# Patient Record
Sex: Female | Born: 1995 | Race: White | Hispanic: No | Marital: Single | Smoking: Never smoker
Health system: Southern US, Community
[De-identification: ages and names within clinical notes are randomized; demographics above are authoritative.]

## PROBLEM LIST (undated history)

## (undated) DIAGNOSIS — F32A Depression, unspecified: Secondary | ICD-10-CM

## (undated) DIAGNOSIS — F419 Anxiety disorder, unspecified: Secondary | ICD-10-CM

## (undated) DIAGNOSIS — M549 Dorsalgia, unspecified: Secondary | ICD-10-CM

## (undated) HISTORY — PX: TONSILLECTOMY: SUR1361

---

## 2017-05-26 ENCOUNTER — Other Ambulatory Visit: Payer: Self-pay

## 2017-05-26 ENCOUNTER — Encounter (HOSPITAL_COMMUNITY): Payer: Self-pay | Admitting: Emergency Medicine

## 2017-05-26 ENCOUNTER — Emergency Department (HOSPITAL_COMMUNITY)
Admission: EM | Admit: 2017-05-26 | Discharge: 2017-05-26 | Disposition: A | Discharge status: Home / Self Care | Payer: BLUE CROSS/BLUE SHIELD | Attending: Emergency Medicine | Admitting: Emergency Medicine

## 2017-05-26 ENCOUNTER — Emergency Department (HOSPITAL_COMMUNITY): Payer: BLUE CROSS/BLUE SHIELD

## 2017-05-26 DIAGNOSIS — M5431 Sciatica, right side: Secondary | ICD-10-CM

## 2017-05-26 DIAGNOSIS — M5432 Sciatica, left side: Secondary | ICD-10-CM | POA: Insufficient documentation

## 2017-05-26 DIAGNOSIS — M545 Low back pain: Secondary | ICD-10-CM | POA: Diagnosis present

## 2017-05-26 LAB — URINALYSIS, ROUTINE W REFLEX MICROSCOPIC
Bilirubin Urine: NEGATIVE
GLUCOSE, UA: NEGATIVE mg/dL
HGB URINE DIPSTICK: NEGATIVE
KETONES UR: NEGATIVE mg/dL
Leukocytes, UA: NEGATIVE
Nitrite: NEGATIVE
PROTEIN: NEGATIVE mg/dL
Specific Gravity, Urine: 1.029 (ref 1.005–1.030)
pH: 5 (ref 5.0–8.0)

## 2017-05-26 LAB — PREGNANCY, URINE: Preg Test, Ur: NEGATIVE

## 2017-05-26 MED ORDER — CYCLOBENZAPRINE HCL 10 MG PO TABS: 10.0000 mg | ORAL_TABLET | Freq: Two times a day (BID) | ORAL | 0 refills | Status: DC | PRN

## 2017-05-26 MED ORDER — KETOROLAC TROMETHAMINE 30 MG/ML IJ SOLN
30.0000 mg | Freq: Once | INTRAMUSCULAR | Status: AC
  Administered 2017-05-26: 30 mg via INTRAMUSCULAR
  Filled 2017-05-26: qty 1

## 2017-05-26 MED ORDER — NAPROXEN 500 MG PO TABS: 500.0000 mg | ORAL_TABLET | Freq: Two times a day (BID) | ORAL | 0 refills | Status: DC

## 2017-05-26 NOTE — ED Triage Notes (Signed)
Pt to ED for lower back pain x several weeks. Endorses intermittent pain that shoots down into bilateral legs and knees. Pt ambulatory. Denies injury.

## 2017-05-26 NOTE — ED Provider Notes (Signed)
[facility] EMERGENCY DEPARTMENT Provider Note   CSN: [id number] Arrival date & time: 05/26/17  1809     History   Chief Complaint Chief Complaint  Patient presents with  . Back Pain    HPI Julie Huber is a 22 y.o. female who presents to ED for evaluation of 1 month history of lower midline back pain describes as aching and worse with ambulation and movement in bed.  Symptoms began gradually 1 month ago with no inciting injury or event.  She has tried several doses of ibuprofen and BC powders with mild improvement in her symptoms.  No previous history of similar symptoms in the past.  She states that yesterday and several times before that, the pain radiating down both of her legs and a sharp and shooting motion.  Denies any numbness in legs, loss of bowel or bladder function, fevers, history of cancer, history of IV drug use, dysuria, prior back surgeries.  HPI  No past medical history on file.  There are no active problems to display for this patient.   Past Surgical History:  Procedure Laterality Date  . TONSILLECTOMY       OB History   None      Home Medications    Prior to Admission medications   Medication Sig Start Date End Date Taking? Authorizing Provider  cyclobenzaprine (FLEXERIL) 10 MG tablet Take 1 tablet (10 mg total) by mouth 2 (two) times daily as needed for muscle spasms. 05/26/17   Wilberto Console, PA-C  naproxen (NAPROSYN) 500 MG tablet Take 1 tablet (500 mg total) by mouth 2 (two) times daily. 05/26/17   Dietrich Pates, PA-C    Family History No family history on file.  Social History Social History   Tobacco Use  . Smoking status: Not on file  Substance Use Topics  . Alcohol use: Not on file  . Drug use: Not on file     Allergies   Penicillins   Review of Systems Review of Systems  Constitutional: Negative for chills and fever.  Genitourinary: Negative for dysuria and flank pain.  Musculoskeletal: Positive for  back pain and myalgias. Negative for arthralgias, gait problem, joint swelling, neck pain and neck stiffness.  Skin: Negative for rash and wound.  Neurological: Negative for weakness and numbness.     Physical Exam Updated Vital Signs BP 103/85 (BP Location: Right Arm)   Pulse (!) 107   Temp 98.2 F (36.8 C) (Oral)   Resp 14   Ht 5\' 5"  (1.651 m)   Wt 134.3 kg (296 lb)   LMP 02/25/2017 (Approximate) Comment: negative preg test  SpO2 99%   BMI 49.26 kg/m   Physical Exam  Constitutional: She appears well-developed and well-nourished. No distress.  Obese. NAD.  HENT:  Head: Normocephalic and atraumatic.  Eyes: Conjunctivae and EOM are normal. No scleral icterus.  Neck: Normal range of motion.  Pulmonary/Chest: Effort normal. No respiratory distress.  Musculoskeletal: Normal range of motion. She exhibits tenderness. She exhibits no edema or deformity.       Back:  Tenderness to palpation at the midline of the lumbar spine. No midline spinal tenderness present in thoracic or cervical spine. No step-off palpated. No visible bruising, edema or temperature change noted. No objective signs of numbness present. No saddle anesthesia. 2+ DP pulses bilaterally. Sensation intact to light touch. Strength 5/5 in bilateral lower extremities.  Neurological: She is alert.  Skin: No rash noted. She is not diaphoretic.  Psychiatric: She  has a normal mood and affect.  Nursing note and vitals reviewed.    ED Treatments / Results  Labs (all labs ordered are listed, but only abnormal results are displayed) Labs Reviewed  URINALYSIS, ROUTINE W REFLEX MICROSCOPIC - Abnormal; Notable for the following components:      Result Value   APPearance HAZY (*)    All other components within normal limits  PREGNANCY, URINE    EKG None  Radiology Dg Lumbar Spine Complete  Result Date: 05/26/2017 CLINICAL DATA:  Lumbago for several weeks EXAM: LUMBAR SPINE - COMPLETE 4+ VIEW COMPARISON:  None.  FINDINGS: Frontal, lateral, spot lumbosacral lateral, and bilateral oblique views were obtained. There are 5 non-rib-bearing lumbar type vertebral bodies. There is a transitional lumbosacral vertebra. There is no fracture or spondylolisthesis. The disc spaces appear unremarkable. There is no appreciable facet arthropathy. IMPRESSION: No fracture or spondylolisthesis.  No evident arthropathy. Electronically Signed   By: Bretta BangWilliam  Woodruff III M.D.   On: 05/26/2017 20:37    Procedures Procedures (including critical care time)  Medications Ordered in ED Medications  ketorolac (TORADOL) 30 MG/ML injection 30 mg (has no administration in time range)     Initial Impression / Assessment and Plan / ED Course  I have reviewed the triage vital signs and the nursing notes.  Pertinent labs & imaging results that were available during my care of the patient were reviewed by me and considered in my medical decision making (see chart for details).     Patient presents to ED for evaluation of 1 month history of lower back pain that has intermittently radiated down both of her legs.  Reports improvement with NSAIDs.  Denies any injuries, falls, prior back surgeries, history of cancer, history of IV drug use, loss of bowel or bladder function or dysuria.  On physical exam she does have some midline lumbar spinal tenderness to palpation.  Lower extremities are neurovascularly intact. UA, Upreg unremarkable. Xray L spine unremarkable.  Suspect that her symptoms are most likely due to sciatica rather than cauda equina, dissection or other infectious or vascular cause.  Will give anti-inflammatories, muscle relaxer and advised her to do heat therapy as directed.  Patient appears stable for discharge at this time.  Strict return precautions given.  Portions of this note were generated with Scientist, clinical (histocompatibility and immunogenetics)Dragon dictation software. Dictation errors may occur despite best attempts at proofreading.   Final Clinical Impressions(s) /  ED Diagnoses   Final diagnoses:  Bilateral sciatica    ED Discharge Orders        Ordered    naproxen (NAPROSYN) 500 MG tablet  2 times daily     05/26/17 2040    cyclobenzaprine (FLEXERIL) 10 MG tablet  2 times daily PRN     05/26/17 2040       Dietrich PatesKhatri, Joncarlo Friberg, PA-C 05/26/17 2046    Alvira MondaySchlossman, Erin, MD 05/27/17 1351

## 2017-05-26 NOTE — ED Notes (Signed)
Patient transported to X-ray 

## 2017-05-26 NOTE — ED Notes (Signed)
Patient verbalizes understanding of discharge instructions. Opportunity for questioning and answers were provided. Armband removed by staff, pt discharged from ED.  

## 2017-09-13 ENCOUNTER — Other Ambulatory Visit: Payer: Self-pay

## 2017-09-13 ENCOUNTER — Emergency Department (HOSPITAL_COMMUNITY)
Admission: EM | Admit: 2017-09-13 | Discharge: 2017-09-13 | Disposition: A | Discharge status: Home / Self Care | Payer: Self-pay | Attending: Emergency Medicine | Admitting: Emergency Medicine

## 2017-09-13 ENCOUNTER — Emergency Department (HOSPITAL_COMMUNITY): Payer: Self-pay

## 2017-09-13 ENCOUNTER — Encounter (HOSPITAL_COMMUNITY): Payer: Self-pay | Admitting: Emergency Medicine

## 2017-09-13 DIAGNOSIS — M25561 Pain in right knee: Secondary | ICD-10-CM | POA: Insufficient documentation

## 2017-09-13 DIAGNOSIS — Z79899 Other long term (current) drug therapy: Secondary | ICD-10-CM | POA: Insufficient documentation

## 2017-09-13 LAB — POC URINE PREG, ED: Preg Test, Ur: NEGATIVE

## 2017-09-13 NOTE — ED Notes (Signed)
Ortho paged. 

## 2017-09-13 NOTE — Progress Notes (Signed)
Orthopedic Tech Progress Note Patient Details:  Julie Huber 05/05/1995 [id number]  Ortho Devices Type of Ortho Device: Ace wrap, Crutches Ortho Device/Splint Location: rle Ortho Device/Splint Interventions: Application   Post Interventions Patient Tolerated: Well Instructions Provided: Care of device   Nikki DomCrawford, Uziel Covault 09/13/2017, 3:40 PM

## 2017-09-13 NOTE — Discharge Instructions (Signed)
It was my pleasure taking care of you today!   Please see the information and instructions below regarding your visit.  Your diagnoses today include:  1. Acute pain of right knee    There are many causes of knee pain.  Often 1 of the ligaments that hold the knee together, or the meniscus could get a small tear if you did a motion where you twisted or planted with a twist.  Based on where your pain is, this could be the meniscus or lateral collateral ligament.  Tests performed today include: See side panel of your discharge paperwork for testing performed today. Vital signs are listed at the bottom of these instructions.   Medications prescribed:    Take any prescribed medications only as prescribed, and any over the counter medications only as directed on the packaging.  Naproxen 375 mg twice daily as needed for pain. Use crutches as needed for comfort. Ice and elevate knee throughout the day.  You may alternate with Tylenol, 600 mg every 6 hours as needed for pain.  Home care instructions:  Please follow any educational materials contained in this packet.   Follow-up instructions: Please follow-up with your primary care provider as soon as possible for further evaluation of your symptoms if they are not completely improved.   Call the orthopedist listed today or tomorrow to schedule follow up appointment for recheck of ongoing knee pain in one to two weeks. That appointment can be canceled with a 24-48 hour notice if complete resolution of pain.   Follow up with the orthopedist listed if symptoms do not improve in one week.   Return instructions:  Please return to the Emergency Department if you experience worsening symptoms.  Please return for any increasing swelling, increasing pain, loss of color to the lower leg, or increasing redness. Please return if you have any other emergent concerns.  Additional Information:   Your vital signs today were: BP 130/72 (BP Location: Right  Arm)    Pulse (!) 108    Temp 98.2 F (36.8 C) (Oral)    Resp 16    Ht 5\' 5"  (1.651 m)    Wt 131.5 kg (290 lb)    SpO2 96%    BMI 48.26 kg/m  If your blood pressure (BP) was elevated on multiple readings during this visit above 130 for the top number or above 80 for the bottom number, please have this repeated by your primary care provider within one month. --------------

## 2017-09-13 NOTE — ED Triage Notes (Addendum)
Pt with right lateral knee pain w/o injury. States when she points her toe she feels a pull. Ambulator with steady gait.

## 2017-09-13 NOTE — ED Notes (Signed)
Patient transported to X-ray 

## 2017-09-13 NOTE — ED Provider Notes (Signed)
[facility] Provider Note   CSN: [id number] Arrival date & time: 09/13/17  1434     History   Chief Complaint Chief Complaint  Patient presents with  . Knee Pain    HPI Julie Huber is a 22 y.o. female.  HPI  Patient is a 22 year old female wit no significant past medical history presenting for right knee pain.  Patient reports that is been aching for a couple weeks, but she noticed that it became acutely more painful yesterday.  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  Patient denies any specific injury when her knee first started hurting.  Patient noticed that it is most painful when she is ambulating, as well as when she is performing plantar flexion.  Patient does note that she saw swelling over the anterolateral aspect of the right knee earlier this week that has gone down.  Patient denies any erythema, fevers or chills.  Patient has been taking naproxen and ibuprofen 800 mg without full relief.  History reviewed. No pertinent past medical history.  There are no active problems to display for this patient.   Past Surgical History:  Procedure Laterality Date  . TONSILLECTOMY       OB History   None      Home Medications    Prior to Admission medications   Medication Sig Start Date End Date Taking? Authorizing Provider  cyclobenzaprine (FLEXERIL) 10 MG tablet Take 1 tablet (10 mg total) by mouth 2 (two) times daily as needed for muscle spasms. 05/26/17   Khatri, Hina, PA-C  naproxen (NAPROSYN) 500 MG tablet Take 1 tablet (500 mg total) by mouth 2 (two) times daily. 05/26/17   Dietrich PatesKhatri, Hina, PA-C    Family History No family history on file.  Social History Social History   Tobacco Use  . Smoking status: Never Smoker  . Smokeless tobacco: Never Used  Substance Use Topics  . Alcohol use: Not Currently  . Drug use:  Never     Allergies   Penicillins   Review of Systems Review of Systems  Constitutional: Negative for chills and fever.  Musculoskeletal: Positive for arthralgias and joint swelling.  Skin: Negative for color change and rash.  Neurological: Negative for weakness and numbness.     Physical Exam Updated Vital Signs BP 130/72 (BP Location: Right Arm)   Pulse (!) 108   Temp 98.2 F (36.8 C) (Oral)   Resp 16   Ht 5\' 5"  (1.651 m)   Wt 131.5 kg (290 lb)   SpO2 96%   BMI 48.26 kg/m   Physical Exam  Constitutional: She appears well-developed and well-nourished. No distress.  Sitting comfortably in bed.  HENT:  Head: Normocephalic and atraumatic.  Eyes: Conjunctivae are normal. Right eye exhibits no discharge. Left eye exhibits no discharge.  EOMs normal to gross examination.  Neck: Normal range of motion.  Cardiovascular: Normal rate and regular rhythm.  Intact, 2+ DP pulses bilaterally.  Pulmonary/Chest:  Normal respiratory effort. Patient converses comfortably. No audible wheeze or stridor.  Abdominal: She exhibits no distension.  Musculoskeletal: Normal range of motion.  Right knee with tenderness to palpation of anterolateral knee. Full ROM.  No joint line tenderness. No joint effusion or swelling appreciated. No abnormal alignment or patellar mobility. No bruising, erythema or warmth overlaying the joint. No varus/valgus laxity. Negative drawer's, Lachman's and McMurray's.  No crepitus.  2+ DP pulses bilaterally. All compartments are soft. Sensation intact distal to injury.   Neurological: She is alert.  Cranial nerves intact to gross observation. Patient moves extremities without difficulty. Antalgic gait, favoring right.  Skin: Skin is warm and dry. She is not diaphoretic.  Psychiatric: She has a normal mood and affect. Her behavior is normal. Judgment and thought content normal.  Nursing note and vitals reviewed.    ED Treatments / Results  Labs (all labs  ordered are listed, but only abnormal results are displayed) Labs Reviewed  POC URINE PREG, ED    EKG None  Radiology Dg Knee Complete 4 Views Right  Result Date: 09/13/2017 CLINICAL DATA:  Pain EXAM: RIGHT KNEE - COMPLETE 4+ VIEW COMPARISON:  None. FINDINGS: Frontal, lateral, and bilateral oblique views were obtained. No fracture or dislocation. No joint effusion. Joint spaces appear normal. No erosive change. IMPRESSION: No evident fracture or joint effusion.  No appreciable arthropathy. Electronically Signed   By: Bretta Bang III M.D.   On: 09/13/2017 15:59    Procedures Procedures (including critical care time)  Medications Ordered in ED Medications - No data to display   Initial Impression / Assessment and Plan / ED Course  I have reviewed the triage vital signs and the nursing notes.  Pertinent labs & imaging results that were available during my care of the patient were reviewed by me and considered in my medical decision making (see chart for details).  Clinical Course as of Sep 13 1701  Tue Sep 13, 2017  1655 Tachycardia resolved.    [AM]    Clinical Course User Index [AM] Elisha Ponder, PA-C    Patient nontoxic-appearing, afebrile, and in no acute distress.  There is no evidence of septic arthritis of the right knee.  No acute injury to suggest ACL tear, or occult fracture. No bone tumors palpated or seen on xray.  Suspect strain of lateral collateral ligament given pain with plantar flexion, or meniscus tear.  Ace wrap applied, patient given crutches with instructions to rest, ice, elevate, and compress.  Return precautions given for any erythema or worsening edema.  Patient is in understanding and agrees with the plan of care.  Final Clinical Impressions(s) / ED Diagnoses   Final diagnoses:  Acute pain of right knee    ED Discharge Orders    None       Delia Chimes 09/13/17 1703    Mesner, Barbara Cower, MD 09/13/17 2103

## 2017-10-29 ENCOUNTER — Emergency Department (HOSPITAL_COMMUNITY): Payer: Self-pay

## 2017-10-29 ENCOUNTER — Other Ambulatory Visit: Payer: Self-pay

## 2017-10-29 ENCOUNTER — Encounter (HOSPITAL_COMMUNITY): Payer: Self-pay | Admitting: Emergency Medicine

## 2017-10-29 ENCOUNTER — Emergency Department (HOSPITAL_COMMUNITY)
Admission: EM | Admit: 2017-10-29 | Discharge: 2017-10-30 | Disposition: A | Discharge status: Home / Self Care | Payer: Self-pay | Attending: Emergency Medicine | Admitting: Emergency Medicine

## 2017-10-29 DIAGNOSIS — R0789 Other chest pain: Secondary | ICD-10-CM | POA: Insufficient documentation

## 2017-10-29 LAB — CBC
HCT: 39.5 % (ref 36.0–46.0)
HEMOGLOBIN: 12.9 g/dL (ref 12.0–15.0)
MCH: 26.2 pg (ref 26.0–34.0)
MCHC: 32.7 g/dL (ref 30.0–36.0)
MCV: 80.3 fL (ref 78.0–100.0)
Platelets: 358 10*3/uL (ref 150–400)
RBC: 4.92 MIL/uL (ref 3.87–5.11)
RDW: 13.3 % (ref 11.5–15.5)
WBC: 9.2 10*3/uL (ref 4.0–10.5)

## 2017-10-29 LAB — BASIC METABOLIC PANEL
ANION GAP: 12 (ref 5–15)
BUN: 12 mg/dL (ref 6–20)
CALCIUM: 9.3 mg/dL (ref 8.9–10.3)
CO2: 25 mmol/L (ref 22–32)
Chloride: 104 mmol/L (ref 98–111)
Creatinine, Ser: 0.62 mg/dL (ref 0.44–1.00)
GFR calc non Af Amer: >60 mL/min (ref >60)
Glucose, Bld: 146 mg/dL — ABNORMAL HIGH (ref 70–99)
POTASSIUM: 4.2 mmol/L (ref 3.5–5.1)
Sodium: 141 mmol/L (ref 135–145)

## 2017-10-29 LAB — POCT I-STAT TROPONIN I: TROPONIN I, POC: 0.01 ng/mL (ref 0.00–0.08)

## 2017-10-29 LAB — I-STAT BETA HCG BLOOD, ED (NOT ORDERABLE)

## 2017-10-29 NOTE — ED Triage Notes (Signed)
Pt reports chest pain for the last 2 days with pain worsening when laying flat.

## 2017-10-30 LAB — D-DIMER, QUANTITATIVE: D-Dimer, Quant: 0.3 ug/mL-FEU (ref 0.00–0.50)

## 2017-10-30 MED ORDER — RANITIDINE HCL 150 MG PO TABS: 150.0000 mg | ORAL_TABLET | Freq: Two times a day (BID) | ORAL | 0 refills | Status: DC

## 2017-10-30 MED ORDER — IBUPROFEN 800 MG PO TABS: 800.0000 mg | ORAL_TABLET | Freq: Three times a day (TID) | ORAL | 0 refills | Status: DC | PRN

## 2017-10-30 NOTE — ED Provider Notes (Signed)
[facility]-EMERGENCY DEPT Provider Note   CSN: [id number] Arrival date & time: 10/29/17  2244     History   Chief Complaint Chief Complaint  Patient presents with  . Chest Pain    HPI Julie Huber is a 22 y.o. female.  Patient presents to the emergency department for evaluation of chest pain.  Patient reports that the pain began 2 days ago.  It is in the center of her chest and radiates under the left breast.  She does report that it seems worse when she lies flat, especially on her stomach and and other certain positions.  She denies any injury.  She has felt like the pain worsens when she breathes at times.  She has not had any cough or fever.  Patient does not use oral contraceptives.  No recent travel.  No unilateral leg swelling.     History reviewed. No pertinent past medical history.  There are no active problems to display for this patient.   Past Surgical History:  Procedure Laterality Date  . TONSILLECTOMY       OB History   None      Home Medications    Prior to Admission medications   Medication Sig Start Date End Date Taking? Authorizing Provider  acetaminophen (TYLENOL) 500 MG tablet Take 1,000 mg by mouth every 6 (six) hours as needed for mild pain.    [provider]  ibuprofen (ADVIL,MOTRIN) 800 MG tablet Take 1 tablet (800 mg total) by mouth every 8 (eight) hours as needed for moderate pain. 10/30/17   Gilda Crease, MD  ranitidine (ZANTAC) 150 MG tablet Take 1 tablet (150 mg total) by mouth 2 (two) times daily. 10/30/17   Gilda Crease, MD    Family History History reviewed. No pertinent family history.  Social History Social History   Tobacco Use  . Smoking status: Never Smoker  . Smokeless tobacco: Never Used  Substance Use Topics  . Alcohol use: Not Currently  . Drug use: Never     Allergies   Penicillins   Review of Systems Review of Systems  Cardiovascular: Positive for  chest pain.  All other systems reviewed and are negative.    Physical Exam Updated Vital Signs BP (!) 146/99 (BP Location: Left Arm) Comment: RN aware  Pulse 91   Temp 97.9 F (36.6 C) (Oral)   Resp 18   Ht 5\' 5"  (1.651 m)   Wt 131.5 kg   SpO2 98%   BMI 48.26 kg/m   Physical Exam  Constitutional: She is oriented to person, place, and time. She appears well-developed and well-nourished. No distress.  HENT:  Head: Normocephalic and atraumatic.  Right Ear: Hearing normal.  Left Ear: Hearing normal.  Nose: Nose normal.  Mouth/Throat: Oropharynx is clear and moist and mucous membranes are normal.  Eyes: Pupils are equal, round, and reactive to light. Conjunctivae and EOM are normal.  Neck: Normal range of motion. Neck supple.  Cardiovascular: Regular rhythm, S1 normal and S2 normal. Exam reveals no gallop and no friction rub.  No murmur heard. Pulmonary/Chest: Effort normal and breath sounds normal. No respiratory distress. She exhibits no tenderness.  Abdominal: Soft. Normal appearance and bowel sounds are normal. There is no hepatosplenomegaly. There is no tenderness. There is no rebound, no guarding, no tenderness at McBurney's point and negative Murphy's sign. No hernia.  Musculoskeletal: Normal range of motion.  Neurological: She is alert and oriented to person, place, and time. She has  normal strength. No cranial nerve deficit or sensory deficit. Coordination normal. GCS eye subscore is 4. GCS verbal subscore is 5. GCS motor subscore is 6.  Skin: Skin is warm, dry and intact. No rash noted. No cyanosis.  Psychiatric: She has a normal mood and affect. Her speech is normal and behavior is normal. Thought content normal.  Nursing note and vitals reviewed.    ED Treatments / Results  Labs (all labs ordered are listed, but only abnormal results are displayed) Labs Reviewed  BASIC METABOLIC PANEL - Abnormal; Notable for the following components:      Result Value   Glucose,  Bld 146 (*)    All other components within normal limits  CBC  D-DIMER, QUANTITATIVE (NOT AT [facility])  I-STAT TROPONIN, ED  I-STAT BETA HCG BLOOD, ED ([facility], [facility], [facility] ONLY)  POCT I-STAT TROPONIN I  I-STAT BETA HCG BLOOD, ED (NOT ORDERABLE)    EKG EKG Interpretation  Date/Time:  Saturday October 29 2017 22:56:36 EDT Ventricular Rate:  97 PR Interval:    QRS Duration: 91 QT Interval:  338 QTC Calculation: 430 R Axis:   68 Text Interpretation:  Sinus rhythm Normal ECG Confirmed by Gilda Crease [phone]) on 10/29/2017 11:56:21 PM   Radiology Dg Chest 2 View  Result Date: 10/29/2017 CLINICAL DATA:  Worsening chest pain for 2 days. EXAM: CHEST - 2 VIEW COMPARISON:  None. FINDINGS: Cardiac silhouette is upper limits of normal size, mediastinal silhouette is not suspicious. No pleural effusion or focal consolidation. No pneumothorax. Large body habitus. Osseous structures are normal. IMPRESSION: Borderline cardiomegaly.  No acute pulmonary process. Electronically Signed   By: Awilda Metro M.D.   On: 10/29/2017 23:32    Procedures Procedures (including critical care time)  Medications Ordered in ED Medications - No data to display   Initial Impression / Assessment and Plan / ED Course  I have reviewed the triage vital signs and the nursing notes.  Pertinent labs & imaging results that were available during my care of the patient were reviewed by me and considered in my medical decision making (see chart for details).     Patient presents with left-sided chest pain.  She does not have any significant cardiac risk factors other than obesity.  Symptoms are not consistent with ACS or MI.  EKG is normal, troponin negative.  Troponin negative after 2 days of continuous and unrelenting pain.  This is not cardiac.  She does not have any signs of DVT on examination, is not tachycardic, tachypneic or dyspneic.  D-dimer is normal.  This is unlikely to be PE based on this work-up.  Patient  does report changes in pain with changes in position, likely chest wall in nature.  Will also cover for reflux with Zantac.  Final Clinical Impressions(s) / ED Diagnoses   Final diagnoses:  Chest wall pain    ED Discharge Orders         Ordered    ranitidine (ZANTAC) 150 MG tablet  2 times daily     10/30/17 0036    ibuprofen (ADVIL,MOTRIN) 800 MG tablet  Every 8 hours PRN     10/30/17 0036           Gilda Crease, MD 10/30/17 [phone]

## 2017-11-13 ENCOUNTER — Other Ambulatory Visit: Payer: Self-pay

## 2017-11-13 ENCOUNTER — Encounter (HOSPITAL_COMMUNITY): Payer: Self-pay

## 2017-11-13 ENCOUNTER — Emergency Department (HOSPITAL_COMMUNITY)
Admission: EM | Admit: 2017-11-13 | Discharge: 2017-11-13 | Disposition: A | Discharge status: Home / Self Care | Payer: Self-pay | Attending: Emergency Medicine | Admitting: Emergency Medicine

## 2017-11-13 DIAGNOSIS — Z79899 Other long term (current) drug therapy: Secondary | ICD-10-CM | POA: Insufficient documentation

## 2017-11-13 DIAGNOSIS — M62830 Muscle spasm of back: Secondary | ICD-10-CM | POA: Insufficient documentation

## 2017-11-13 HISTORY — DX: Dorsalgia, unspecified: M54.9

## 2017-11-13 MED ORDER — METHOCARBAMOL 500 MG PO TABS: 500.0000 mg | ORAL_TABLET | Freq: Two times a day (BID) | ORAL | 0 refills | Status: DC

## 2017-11-13 MED ORDER — NAPROXEN 500 MG PO TABS: 500.0000 mg | ORAL_TABLET | Freq: Two times a day (BID) | ORAL | 0 refills | Status: DC

## 2017-11-13 MED ORDER — METHOCARBAMOL 500 MG PO TABS
500.0000 mg | ORAL_TABLET | Freq: Once | ORAL | Status: AC
  Administered 2017-11-13: 500 mg via ORAL
  Filled 2017-11-13: qty 1

## 2017-11-13 MED ORDER — KETOROLAC TROMETHAMINE 30 MG/ML IJ SOLN
30.0000 mg | Freq: Once | INTRAMUSCULAR | Status: AC
  Administered 2017-11-13: 30 mg via INTRAMUSCULAR
  Filled 2017-11-13: qty 1

## 2017-11-13 NOTE — ED Triage Notes (Signed)
Pt BIB POV for eval of lower back pain. Pt reports she was seen for same approx 2 mos ago, started on flexeril and naprosyn which she reports. Stated at one point pain radiated down blt posterior legs, but at this time states pain starts at base of spine and radiates upwards

## 2017-11-13 NOTE — Discharge Instructions (Addendum)
Call Dr. Woodward KuHaddox's office to schedule follow up. Return here as needed.

## 2017-11-13 NOTE — ED Provider Notes (Signed)
[facility] EMERGENCY DEPARTMENT Provider Note   CSN: [id number] Arrival date & time: 11/13/17  1834     History   Chief Complaint Chief Complaint  Patient presents with  . Back Pain    HPI Julie Huber is a 22 y.o. female who presents to the ED with low back pain. Patient reports she was seen for the same problem 04/2017 and prescribed flexeril and naprosyn. Patient reports she had x-rays and dx with sciatica. The pain at that time was going down the legs but now going up the back. Patient reports the pain starts at the base of her spine and radiates to the upper back. No loss of control of bladder or bowels.  HPI  Past Medical History:  Diagnosis Date  . Back pain     There are no active problems to display for this patient.   Past Surgical History:  Procedure Laterality Date  . TONSILLECTOMY       OB History   None      Home Medications    Prior to Admission medications   Medication Sig Start Date End Date Taking? Authorizing Provider  acetaminophen (TYLENOL) 500 MG tablet Take 1,000 mg by mouth every 6 (six) hours as needed for mild pain.    [provider]  methocarbamol (ROBAXIN) 500 MG tablet Take 1 tablet (500 mg total) by mouth 2 (two) times daily. 11/13/17   Janne Napoleon, NP  naproxen (NAPROSYN) 500 MG tablet Take 1 tablet (500 mg total) by mouth 2 (two) times daily. 11/13/17   Janne Napoleon, NP  ranitidine (ZANTAC) 150 MG tablet Take 1 tablet (150 mg total) by mouth 2 (two) times daily. 10/30/17   Gilda Crease, MD    Family History History reviewed. No pertinent family history.  Social History Social History   Tobacco Use  . Smoking status: Never Smoker  . Smokeless tobacco: Never Used  Substance Use Topics  . Alcohol use: Not Currently  . Drug use: Never     Allergies   Penicillins   Review of Systems Review of Systems  Constitutional: Negative for chills and fever.  HENT: Negative.   Eyes:  Negative for pain, discharge and itching.  Respiratory: Negative for shortness of breath.   Cardiovascular: Negative for chest pain.  Gastrointestinal: Negative for abdominal pain, nausea and vomiting.  Genitourinary: Negative for dysuria, frequency and urgency.  Musculoskeletal: Positive for back pain.  Skin: Negative for rash and wound.  Neurological: Negative for syncope and headaches.  Psychiatric/Behavioral: Negative for confusion.     Physical Exam Updated Vital Signs BP 132/89 (BP Location: Right Arm)   Pulse (!) 115   Temp 98.7 F (37.1 C) (Oral)   Resp 18   Ht 5\' 5"  (1.651 m)   Wt 136.1 kg   LMP 11/13/2017   SpO2 98%   BMI 49.92 kg/m   Physical Exam  Constitutional: No distress.  Morbidly obese  HENT:  Head: Normocephalic and atraumatic.  Eyes: Conjunctivae and EOM are normal.  Neck: Normal range of motion. Neck supple.  Cardiovascular: Normal rate and regular rhythm.  Pulmonary/Chest: Effort normal and breath sounds normal.  Abdominal: Soft. Bowel sounds are normal. There is no tenderness.  Musculoskeletal: Normal range of motion.       Lumbar back: She exhibits tenderness and spasm. She exhibits normal range of motion, no edema, no deformity and normal pulse.  Straight leg raises without difficulty  Neurological: She is alert. She has  normal strength and normal reflexes. No sensory deficit. Gait normal.  Skin: Skin is warm and dry.  Psychiatric: She has a normal mood and affect. Her behavior is normal.  Nursing note and vitals reviewed.    ED Treatments / Results  Labs (all labs ordered are listed, but only abnormal results are displayed) Labs Reviewed - No data to display  Radiology No results found.  Procedures Procedures (including critical care time)  Medications Ordered in ED Medications  methocarbamol (ROBAXIN) tablet 500 mg (has no administration in time range)  ketorolac (TORADOL) 30 MG/ML injection 30 mg (has no administration in time  range)     Initial Impression / Assessment and Plan / ED Course  I have reviewed the triage vital signs and the nursing notes. Patient with back pain.  No neurological deficits and normal neuro exam.  Patient can walk but states is painful.  No loss of bowel or bladder control.  No concern for cauda equina.  No fever, night sweats, weight loss, h/o cancer, IVDU.  RICE protocol and pain medicine indicated and discussed with patient. Since this has been a repeated problem will refer patient to ortho.   Final Clinical Impressions(s) / ED Diagnoses   Final diagnoses:  Muscle spasm of back    ED Discharge Orders         Ordered    methocarbamol (ROBAXIN) 500 MG tablet  2 times daily     11/13/17 1920    naproxen (NAPROSYN) 500 MG tablet  2 times daily     11/13/17 1920           Damian Leavelleese, [place] Long PointHope M, NP 11/13/17 1926    Virgina NorfolkCuratolo, Adam, DO 11/13/17 2340

## 2017-11-26 ENCOUNTER — Other Ambulatory Visit: Payer: Self-pay

## 2017-11-26 ENCOUNTER — Emergency Department (HOSPITAL_COMMUNITY): Payer: Self-pay

## 2017-11-26 ENCOUNTER — Emergency Department (HOSPITAL_COMMUNITY)
Admission: EM | Admit: 2017-11-26 | Discharge: 2017-11-27 | Disposition: A | Discharge status: Home / Self Care | Payer: Self-pay | Attending: Emergency Medicine | Admitting: Emergency Medicine

## 2017-11-26 ENCOUNTER — Encounter (HOSPITAL_COMMUNITY): Payer: Self-pay

## 2017-11-26 DIAGNOSIS — R109 Unspecified abdominal pain: Secondary | ICD-10-CM

## 2017-11-26 DIAGNOSIS — R1032 Left lower quadrant pain: Secondary | ICD-10-CM | POA: Insufficient documentation

## 2017-11-26 DIAGNOSIS — Z79899 Other long term (current) drug therapy: Secondary | ICD-10-CM | POA: Insufficient documentation

## 2017-11-26 DIAGNOSIS — R000 Tachycardia, unspecified: Secondary | ICD-10-CM | POA: Insufficient documentation

## 2017-11-26 LAB — COMPREHENSIVE METABOLIC PANEL
ALK PHOS: 76 U/L (ref 38–126)
ALT: 22 U/L (ref 0–44)
ANION GAP: 8 (ref 5–15)
AST: 25 U/L (ref 15–41)
Albumin: 3.5 g/dL (ref 3.5–5.0)
BILIRUBIN TOTAL: 0.5 mg/dL (ref 0.3–1.2)
BUN: 9 mg/dL (ref 6–20)
CALCIUM: 9.2 mg/dL (ref 8.9–10.3)
CO2: 27 mmol/L (ref 22–32)
CREATININE: 0.76 mg/dL (ref 0.44–1.00)
Chloride: 103 mmol/L (ref 98–111)
GFR calc Af Amer: >60 mL/min (ref >60)
Glucose, Bld: 120 mg/dL — ABNORMAL HIGH (ref 70–99)
Potassium: 3.7 mmol/L (ref 3.5–5.1)
Sodium: 138 mmol/L (ref 135–145)
TOTAL PROTEIN: 6.6 g/dL (ref 6.5–8.1)

## 2017-11-26 LAB — CBC
HCT: 39.9 % (ref 36.0–46.0)
HEMOGLOBIN: 12.4 g/dL (ref 12.0–15.0)
MCH: 25.8 pg — AB (ref 26.0–34.0)
MCHC: 31.1 g/dL (ref 30.0–36.0)
MCV: 83.1 fL (ref 78.0–100.0)
PLATELETS: 328 10*3/uL (ref 150–400)
RBC: 4.8 MIL/uL (ref 3.87–5.11)
RDW: 13.2 % (ref 11.5–15.5)
WBC: 10.5 10*3/uL (ref 4.0–10.5)

## 2017-11-26 LAB — URINALYSIS, ROUTINE W REFLEX MICROSCOPIC
Bilirubin Urine: NEGATIVE
Glucose, UA: NEGATIVE mg/dL
HGB URINE DIPSTICK: NEGATIVE
Ketones, ur: NEGATIVE mg/dL
LEUKOCYTES UA: NEGATIVE
NITRITE: NEGATIVE
Protein, ur: NEGATIVE mg/dL
Specific Gravity, Urine: 1.032 — ABNORMAL HIGH (ref 1.005–1.030)
pH: 5 (ref 5.0–8.0)

## 2017-11-26 LAB — I-STAT BETA HCG BLOOD, ED (MC, WL, AP ONLY): I-stat hCG, quantitative: <5 m[IU]/mL

## 2017-11-26 LAB — LIPASE, BLOOD: Lipase: 25 U/L (ref 11–51)

## 2017-11-26 MED ORDER — KETOROLAC TROMETHAMINE 60 MG/2ML IM SOLN
60.0000 mg | Freq: Once | INTRAMUSCULAR | Status: AC
  Administered 2017-11-26: 60 mg via INTRAMUSCULAR
  Filled 2017-11-26: qty 2

## 2017-11-26 NOTE — ED Notes (Signed)
Patient transported to Ultrasound 

## 2017-11-26 NOTE — ED Provider Notes (Signed)
[facility] EMERGENCY DEPARTMENT Provider Note   CSN: [id number] Arrival date & time: 11/26/17  1834     History   Chief Complaint Chief Complaint  Patient presents with  . Abdominal Pain    HPI Julie Huber is a 22 y.o. female.  Patient presents with left lower flank pain mild radiation to the groin.  No history of similar.  No history of kidney infection or kidney stones.  No abdominal surgery history.  No fevers or chills.  No urinary symptoms or vaginal symptoms.  Pain for the past 3 days intermittent.     Past Medical History:  Diagnosis Date  . Back pain     There are no active problems to display for this patient.   Past Surgical History:  Procedure Laterality Date  . TONSILLECTOMY       OB History   None      Home Medications    Prior to Admission medications   Medication Sig Start Date End Date Taking? Authorizing Provider  acetaminophen (TYLENOL) 500 MG tablet Take 1,000 mg by mouth every 6 (six) hours as needed for mild pain.   Yes [provider]  cyclobenzaprine (FLEXERIL) 10 MG tablet Take 10 mg by mouth daily as needed for muscle spasms.   Yes [provider]  ferrous sulfate 325 (65 FE) MG tablet Take 325 mg by mouth daily after breakfast.   Yes [provider]  naproxen (NAPROSYN) 500 MG tablet Take 1 tablet (500 mg total) by mouth 2 (two) times daily. Patient taking differently: Take 500 mg by mouth 2 (two) times daily as needed (pain).  11/13/17  Yes Neese, Hope M, NP  methocarbamol (ROBAXIN) 500 MG tablet Take 1 tablet (500 mg total) by mouth 2 (two) times daily. Patient not taking: Reported on 11/26/2017 11/13/17   Janne Napoleon, NP  ranitidine (ZANTAC) 150 MG tablet Take 1 tablet (150 mg total) by mouth 2 (two) times daily. Patient not taking: Reported on 11/26/2017 10/30/17   Gilda Crease, MD    Family History No family history on file.  Social History Social History    Tobacco Use  . Smoking status: Never Smoker  . Smokeless tobacco: Never Used  Substance Use Topics  . Alcohol use: Not Currently  . Drug use: Never     Allergies   Penicillins   Review of Systems Review of Systems  Constitutional: Negative for chills and fever.  HENT: Negative for congestion.   Eyes: Negative for visual disturbance.  Respiratory: Negative for shortness of breath.   Cardiovascular: Negative for chest pain.  Gastrointestinal: Positive for abdominal pain. Negative for vomiting.  Genitourinary: Positive for flank pain. Negative for dysuria.  Musculoskeletal: Negative for back pain, neck pain and neck stiffness.  Skin: Negative for rash.  Neurological: Negative for light-headedness and headaches.     Physical Exam Updated Vital Signs BP (!) 106/58   Pulse 98   Temp 97.8 F (36.6 C) (Oral)   Resp 16   LMP 11/13/2017   SpO2 97%   Physical Exam  Constitutional: She is oriented to person, place, and time. She appears well-developed and well-nourished.  HENT:  Head: Normocephalic and atraumatic.  Eyes: Conjunctivae are normal. Right eye exhibits no discharge. Left eye exhibits no discharge.  Neck: Normal range of motion. Neck supple. No tracheal deviation present.  Cardiovascular: Regular rhythm. Tachycardia present.  Pulmonary/Chest: Effort normal and breath sounds normal.  Abdominal: Soft. She exhibits no distension. There  is no tenderness. There is no guarding.  Musculoskeletal: She exhibits no edema.  Neurological: She is alert and oriented to person, place, and time.  Skin: Skin is warm. No rash noted.  Psychiatric: She has a normal mood and affect.  Nursing note and vitals reviewed.    ED Treatments / Results  Labs (all labs ordered are listed, but only abnormal results are displayed) Labs Reviewed  COMPREHENSIVE METABOLIC PANEL - Abnormal; Notable for the following components:      Result Value   Glucose, Bld 120 (*)    All other  components within normal limits  CBC - Abnormal; Notable for the following components:   MCH 25.8 (*)    All other components within normal limits  URINALYSIS, ROUTINE W REFLEX MICROSCOPIC - Abnormal; Notable for the following components:   Color, Urine AMBER (*)    APPearance HAZY (*)    Specific Gravity, Urine 1.032 (*)    All other components within normal limits  LIPASE, BLOOD  I-STAT BETA HCG BLOOD, ED ([facility], [facility], [facility] ONLY)    EKG None  Radiology US Transvaginal Non-ob  Result Date: 11/26/2017 CLINICAL DATA:  22 year old female with left adnexal pain. EXAM: TRANSABDOMINAL AND TRANSVAGINAL ULTRASOUND OF PELVIS DOPPLER ULTRASOUND OF OVARIES TECHNIQUE: Both transabdominal and transvaginal ultrasound examinations of the pelvis were performed. Transabdominal technique was performed for global imaging of the pelvis including uterus, ovaries, adnexal regions, and pelvic cul-de-sac. It was necessary to proceed with endovaginal exam following the transabdominal exam to visualize the endometrium and ovaries. Color and duplex Doppler ultrasound was utilized to evaluate blood flow to the ovaries. COMPARISON:  CT of the abdomen pelvis dated 11/26/2017 FINDINGS: Uterus Measurements: 7.4 x 3.0 x 3.9 cm. No fibroids or other mass visualized. Endometrium Thickness: 7 mm.  No focal abnormality visualized. Right ovary Measurements: 2.9 x 2.2 x 1.8 cm. Normal appearance/no adnexal mass. Left ovary Measurements: 2.7 x 1.9 x 2.1 cm. Normal appearance/no adnexal mass. Pulsed Doppler evaluation of both ovaries demonstrates normal low-resistance arterial and venous waveforms. Other findings No abnormal free fluid. IMPRESSION: Unremarkable pelvic ultrasound. Electronically Signed   By: Elgie Collard M.D.   On: 11/26/2017 23:51   US Pelvis Complete  Result Date: 11/26/2017 CLINICAL DATA:  22 year old female with left adnexal pain. EXAM: TRANSABDOMINAL AND TRANSVAGINAL ULTRASOUND OF PELVIS DOPPLER ULTRASOUND OF  OVARIES TECHNIQUE: Both transabdominal and transvaginal ultrasound examinations of the pelvis were performed. Transabdominal technique was performed for global imaging of the pelvis including uterus, ovaries, adnexal regions, and pelvic cul-de-sac. It was necessary to proceed with endovaginal exam following the transabdominal exam to visualize the endometrium and ovaries. Color and duplex Doppler ultrasound was utilized to evaluate blood flow to the ovaries. COMPARISON:  CT of the abdomen pelvis dated 11/26/2017 FINDINGS: Uterus Measurements: 7.4 x 3.0 x 3.9 cm. No fibroids or other mass visualized. Endometrium Thickness: 7 mm.  No focal abnormality visualized. Right ovary Measurements: 2.9 x 2.2 x 1.8 cm. Normal appearance/no adnexal mass. Left ovary Measurements: 2.7 x 1.9 x 2.1 cm. Normal appearance/no adnexal mass. Pulsed Doppler evaluation of both ovaries demonstrates normal low-resistance arterial and venous waveforms. Other findings No abnormal free fluid. IMPRESSION: Unremarkable pelvic ultrasound. Electronically Signed   By: Elgie Collard M.D.   On: 11/26/2017 23:51   [place] Art/ven Flow Abd Pelv Doppler  Result Date: 11/26/2017 CLINICAL DATA:  22 year old female with left adnexal pain. EXAM: TRANSABDOMINAL AND TRANSVAGINAL ULTRASOUND OF PELVIS DOPPLER ULTRASOUND OF OVARIES TECHNIQUE: Both transabdominal and transvaginal ultrasound  examinations of the pelvis were performed. Transabdominal technique was performed for global imaging of the pelvis including uterus, ovaries, adnexal regions, and pelvic cul-de-sac. It was necessary to proceed with endovaginal exam following the transabdominal exam to visualize the endometrium and ovaries. Color and duplex Doppler ultrasound was utilized to evaluate blood flow to the ovaries. COMPARISON:  CT of the abdomen pelvis dated 11/26/2017 FINDINGS: Uterus Measurements: 7.4 x 3.0 x 3.9 cm. No fibroids or other mass visualized. Endometrium Thickness: 7 mm.  No focal  abnormality visualized. Right ovary Measurements: 2.9 x 2.2 x 1.8 cm. Normal appearance/no adnexal mass. Left ovary Measurements: 2.7 x 1.9 x 2.1 cm. Normal appearance/no adnexal mass. Pulsed Doppler evaluation of both ovaries demonstrates normal low-resistance arterial and venous waveforms. Other findings No abnormal free fluid. IMPRESSION: Unremarkable pelvic ultrasound. Electronically Signed   By: Elgie Collard M.D.   On: 11/26/2017 23:51   Ct Renal Stone Study  Result Date: 11/26/2017 CLINICAL DATA:  Left flank pain, nausea EXAM: CT ABDOMEN AND PELVIS WITHOUT CONTRAST TECHNIQUE: Multidetector CT imaging of the abdomen and pelvis was performed following the standard protocol without IV contrast. COMPARISON:  None. FINDINGS: Lower chest: Lung bases are clear. Hepatobiliary: Unenhanced liver is unremarkable. Gallbladder is underdistended but unremarkable. No intrahepatic or extrahepatic ductal dilatation. Pancreas: Within normal limits. Spleen: Within normal limits. Adrenals/Urinary Tract: Adrenal glands are within normal limits. Kidneys are within normal limits. No renal, ureteral, or bladder calculi. No hydronephrosis. Bladder is underdistended although unremarkable. Stomach/Bowel: Stomach is within normal limits. No evidence of bowel obstruction. Normal appendix (series 3/image 67). Left colon is decompressed. Vascular/Lymphatic: No evidence of abdominal aortic aneurysm. No suspicious abdominopelvic lymphadenopathy. Reproductive: Uterus is within normal limits. Bilateral ovaries are within normal limits. Other: No abdominopelvic ascites. Musculoskeletal: Visualized osseous structures are within normal limits. IMPRESSION: Negative CT abdomen/pelvis. Electronically Signed   By: Charline Bills M.D.   On: 11/26/2017 21:20    Procedures Procedures (including critical care time)  Medications Ordered in ED Medications  ketorolac (TORADOL) injection 60 mg (60 mg Intramuscular Given 11/26/17 2118)      Initial Impression / Assessment and Plan / ED Course  I have reviewed the triage vital signs and the nursing notes.  Pertinent labs & imaging results that were available during my care of the patient were reviewed by me and considered in my medical decision making (see chart for details).    Patient presents with left flank pain rating to the groin.  Clinical concern for kidney stone versus urine infection.  Urinalysis no significant signs of infection reviewed.  CT scan pending.  Pain meds ordered. Preg neg.  Patient's pain controlled on reassessment well-appearing.  CT scan no acute findings no kidney stone.  Ultrasound no cause for her left flank pain.  Patient stable for outpatient follow-up.  Results and differential diagnosis were discussed with the patient/parent/guardian. Xrays were independently reviewed by myself.  Close follow up outpatient was discussed, comfortable with the plan.   Medications  ketorolac (TORADOL) injection 60 mg (60 mg Intramuscular Given 11/26/17 2118)    Vitals:   11/26/17 2030 11/26/17 2130 11/26/17 2200 11/26/17 2230  BP: 111/62 122/72 109/73 (!) 106/58  Pulse: (!) 109 89 (!) 107 98  Resp: (!) 9 20 (!) 22 16  Temp:      TempSrc:      SpO2: 97% 97% 97% 97%    Final diagnoses:  Acute left flank pain       Final Clinical Impressions(s) / ED  Diagnoses   Final diagnoses:  Acute left flank pain    ED Discharge Orders    None       Blane Ohara, MD 11/27/17 0010

## 2017-11-26 NOTE — ED Triage Notes (Signed)
Pt states that for the past 3 days she has been having LLQ abd pain that radiates to her L flank area, denies urinary symptoms, denies discharge or fevers.

## 2017-11-26 NOTE — Discharge Instructions (Signed)
Follow-up with a primary doctor if your symptoms worsen or new symptoms Monday or Tuesday. Take Tylenol and or Motrin as needed for pain.

## 2018-02-19 ENCOUNTER — Other Ambulatory Visit: Payer: Self-pay

## 2018-02-19 ENCOUNTER — Emergency Department (HOSPITAL_COMMUNITY)
Admission: EM | Admit: 2018-02-19 | Discharge: 2018-02-20 | Disposition: A | Discharge status: Home / Self Care | Payer: Self-pay | Attending: Emergency Medicine | Admitting: Emergency Medicine

## 2018-02-19 ENCOUNTER — Encounter (HOSPITAL_COMMUNITY): Payer: Self-pay | Admitting: Emergency Medicine

## 2018-02-19 DIAGNOSIS — R1084 Generalized abdominal pain: Secondary | ICD-10-CM | POA: Insufficient documentation

## 2018-02-19 NOTE — ED Triage Notes (Signed)
Patient here c/o lower abdominal pain radiating to lower back. Pain 6/10, shooting pain. No urinary complaints. Also states she's had thick yellow vaginal discharge in the past week.

## 2018-02-20 LAB — CBC WITH DIFFERENTIAL/PLATELET
ABS IMMATURE GRANULOCYTES: 0.06 10*3/uL (ref 0.00–0.07)
Basophils Absolute: 0 10*3/uL (ref 0.0–0.1)
Basophils Relative: 0 %
Eosinophils Absolute: 0.3 10*3/uL (ref 0.0–0.5)
Eosinophils Relative: 2 %
HEMATOCRIT: 42.5 % (ref 36.0–46.0)
HEMOGLOBIN: 12.9 g/dL (ref 12.0–15.0)
Immature Granulocytes: 1 %
LYMPHS ABS: 3.3 10*3/uL (ref 0.7–4.0)
Lymphocytes Relative: 32 %
MCH: 25 pg — AB (ref 26.0–34.0)
MCHC: 30.4 g/dL (ref 30.0–36.0)
MCV: 82.2 fL (ref 80.0–100.0)
MONO ABS: 0.8 10*3/uL (ref 0.1–1.0)
MONOS PCT: 8 %
NEUTROS ABS: 5.8 10*3/uL (ref 1.7–7.7)
Neutrophils Relative %: 57 %
Platelets: 353 10*3/uL (ref 150–400)
RBC: 5.17 MIL/uL — ABNORMAL HIGH (ref 3.87–5.11)
RDW: 13.3 % (ref 11.5–15.5)
WBC: 10.3 10*3/uL (ref 4.0–10.5)
nRBC: 0 % (ref 0.0–0.2)

## 2018-02-20 LAB — COMPREHENSIVE METABOLIC PANEL
ALK PHOS: 83 U/L (ref 38–126)
ALT: 25 U/L (ref 0–44)
AST: 20 U/L (ref 15–41)
Albumin: 3.6 g/dL (ref 3.5–5.0)
Anion gap: 11 (ref 5–15)
BUN: 9 mg/dL (ref 6–20)
CALCIUM: 9.3 mg/dL (ref 8.9–10.3)
CO2: 28 mmol/L (ref 22–32)
CREATININE: 0.77 mg/dL (ref 0.44–1.00)
Chloride: 103 mmol/L (ref 98–111)
Glucose, Bld: 100 mg/dL — ABNORMAL HIGH (ref 70–99)
Potassium: 4 mmol/L (ref 3.5–5.1)
SODIUM: 142 mmol/L (ref 135–145)
Total Bilirubin: 0.6 mg/dL (ref 0.3–1.2)
Total Protein: 7.1 g/dL (ref 6.5–8.1)

## 2018-02-20 LAB — URINALYSIS, ROUTINE W REFLEX MICROSCOPIC
BILIRUBIN URINE: NEGATIVE
Glucose, UA: NEGATIVE mg/dL
HGB URINE DIPSTICK: NEGATIVE
Ketones, ur: NEGATIVE mg/dL
Leukocytes, UA: NEGATIVE
Nitrite: NEGATIVE
PH: 6 (ref 5.0–8.0)
Protein, ur: NEGATIVE mg/dL
SPECIFIC GRAVITY, URINE: 1.025 (ref 1.005–1.030)

## 2018-02-20 LAB — GC/CHLAMYDIA PROBE AMP (~~LOC~~) NOT AT ARMC
CHLAMYDIA, DNA PROBE: NEGATIVE
Neisseria Gonorrhea: NEGATIVE

## 2018-02-20 LAB — WET PREP, GENITAL
Clue Cells Wet Prep HPF POC: NONE SEEN
Sperm: NONE SEEN
TRICH WET PREP: NONE SEEN
Yeast Wet Prep HPF POC: NONE SEEN

## 2018-02-20 LAB — PREGNANCY, URINE: Preg Test, Ur: NEGATIVE

## 2018-02-20 MED ORDER — SODIUM CHLORIDE 0.9 % IV BOLUS
1000.0000 mL | Freq: Once | INTRAVENOUS | Status: AC
  Administered 2018-02-20: 1000 mL via INTRAVENOUS

## 2018-02-20 MED ORDER — DICYCLOMINE HCL 20 MG PO TABS: 20.0000 mg | ORAL_TABLET | Freq: Two times a day (BID) | ORAL | 0 refills | Status: DC | PRN

## 2018-02-20 MED ORDER — KETOROLAC TROMETHAMINE 30 MG/ML IJ SOLN
30.0000 mg | Freq: Once | INTRAMUSCULAR | Status: AC
  Administered 2018-02-20: 30 mg via INTRAVENOUS
  Filled 2018-02-20: qty 1

## 2018-02-20 MED ORDER — IBUPROFEN 600 MG PO TABS: 600.0000 mg | ORAL_TABLET | Freq: Four times a day (QID) | ORAL | 0 refills | Status: DC | PRN

## 2018-02-20 NOTE — Discharge Instructions (Signed)
Drink plenty of fluids to prevent dehydration.  We recommend use of ibuprofen and Bentyl for abdominal cramping.  Follow-up with a primary doctor as soon as you are able.  You may return to the ED for new or concerning symptoms.

## 2018-02-20 NOTE — ED Notes (Signed)
Pelvic at bedside   

## 2018-02-20 NOTE — ED Notes (Signed)
Patient verbalizes understanding of medications and discharge instructions. No further questions at this time. VSS and patient ambulatory at discharge.   

## 2018-02-20 NOTE — ED Provider Notes (Signed)
[facility] Provider Note   CSN: [id number] Arrival date & time: 02/19/18  2342     History   Chief Complaint Chief Complaint  Patient presents with  . Abdominal Pain    HPI Julie Huber is a 22 y.o. female.  22 year old female presents to the emergency department for evaluation of abdominal pain.  She reports sharp, shooting pain over the past week which has been intermittent.  It is worse with certain positions and is currently rated at 6/10.  She has taken Pamprin and Midol without relief.  Developed diarrhea 2 days ago which has been watery.  Notes onset of thick, yellow vaginal discharge yesterday.  She has been sexually active with one partner in the past 6 months.  Endorses unprotected sex, but denies concern for STDs.  She has not had any fevers, vomiting, dysuria, hematuria, melena.  No history of abdominal surgeries.  LMP was 1 month ago, approximately.  The history is provided by the patient. No language interpreter was used.  Abdominal Pain   Associated symptoms include diarrhea and nausea. Pertinent negatives include fever and vomiting.    Past Medical History:  Diagnosis Date  . Back pain     There are no active problems to display for this patient.   Past Surgical History:  Procedure Laterality Date  . TONSILLECTOMY       OB History   No obstetric history on file.      Home Medications    Prior to Admission medications   Medication Sig Start Date End Date Taking? Authorizing Provider  acetaminophen (TYLENOL) 500 MG tablet Take 1,000 mg by mouth every 6 (six) hours as needed for mild pain.   Yes [provider]  cyclobenzaprine (FLEXERIL) 10 MG tablet Take 10 mg by mouth daily as needed for muscle spasms.   Yes [provider]  ferrous sulfate 325 (65 FE) MG tablet Take 325 mg by mouth daily after breakfast.   Yes [provider]  naproxen (NAPROSYN) 500 MG tablet Take 1 tablet  (500 mg total) by mouth 2 (two) times daily. Patient taking differently: Take 500 mg by mouth 2 (two) times daily as needed (pain).  11/13/17  Yes Neese, Hope M, NP  dicyclomine (BENTYL) 20 MG tablet Take 1 tablet (20 mg total) by mouth every 12 (twelve) hours as needed (for abdominal pain/cramping). 02/20/18   Antony MaduraHumes, Latajah Thuman, PA-C  ibuprofen (ADVIL,MOTRIN) 600 MG tablet Take 1 tablet (600 mg total) by mouth every 6 (six) hours as needed for mild pain or moderate pain. 02/20/18   Antony MaduraHumes, Jodeci Roarty, PA-C  methocarbamol (ROBAXIN) 500 MG tablet Take 1 tablet (500 mg total) by mouth 2 (two) times daily. Patient not taking: Reported on 11/26/2017 11/13/17   Janne NapoleonNeese, Hope M, NP  ranitidine (ZANTAC) 150 MG tablet Take 1 tablet (150 mg total) by mouth 2 (two) times daily. Patient not taking: Reported on 11/26/2017 10/30/17   Gilda CreasePollina, Christopher J, MD    Family History History reviewed. No pertinent family history.  Social History Social History   Tobacco Use  . Smoking status: Never Smoker  . Smokeless tobacco: Never Used  Substance Use Topics  . Alcohol use: Not Currently  . Drug use: Never     Allergies   Penicillins   Review of Systems Review of Systems  Constitutional: Negative for fever.  Gastrointestinal: Positive for abdominal pain, diarrhea and nausea. Negative for vomiting.  Genitourinary: Positive for vaginal discharge.  Ten systems reviewed  and are negative for acute change, except as noted in the HPI.    Physical Exam Updated Vital Signs BP (!) 146/74 (BP Location: Right Wrist)   Pulse 96   Temp 98.3 F (36.8 C) (Oral)   Resp 19   Ht 5\' 5"  (1.651 m)   Wt 129.3 kg   LMP 01/16/2018   SpO2 100%   BMI 47.43 kg/m   Physical Exam Vitals signs and nursing note reviewed.  Constitutional:      General: She is not in acute distress.    Appearance: She is well-developed. She is not diaphoretic.     Comments: Super morbidly obese  HENT:     Head: Normocephalic and atraumatic.    Eyes:     General: No scleral icterus.    Conjunctiva/sclera: Conjunctivae normal.  Neck:     Musculoskeletal: Normal range of motion.  Cardiovascular:     Rate and Rhythm: Normal rate and regular rhythm.     Pulses: Normal pulses.  Pulmonary:     Effort: Pulmonary effort is normal. No respiratory distress.  Abdominal:     Palpations: Abdomen is soft.     Tenderness: There is abdominal tenderness. There is no guarding.     Comments: Exam limited 2/2 body habitus. TTP in the bilateral lower quadrants as well as periumbilically. No peritoneal signs noted.  Genitourinary:    Vagina: Vaginal discharge present.     Cervix: No cervical motion tenderness.     Comments: Exam challenging due to body habitus. There is scant pale yellow discharge in the vaginal vault. No CMT. No bleeding or trauma to vaginal vault. Musculoskeletal: Normal range of motion.  Skin:    General: Skin is warm and dry.     Coloration: Skin is not pale.     Findings: No erythema or rash.  Neurological:     Mental Status: She is alert and oriented to person, place, and time.     Coordination: Coordination normal.     Comments: Moving all extremities spontaneously.  Psychiatric:        Behavior: Behavior normal.      ED Treatments / Results  Labs (all labs ordered are listed, but only abnormal results are displayed) Labs Reviewed  WET PREP, GENITAL - Abnormal; Notable for the following components:      Result Value   WBC, Wet Prep HPF POC MANY (*)    All other components within normal limits  CBC WITH DIFFERENTIAL/PLATELET - Abnormal; Notable for the following components:   RBC 5.17 (*)    MCH 25.0 (*)    All other components within normal limits  COMPREHENSIVE METABOLIC PANEL - Abnormal; Notable for the following components:   Glucose, Bld 100 (*)    All other components within normal limits  URINALYSIS, ROUTINE W REFLEX MICROSCOPIC  PREGNANCY, URINE  GC/CHLAMYDIA PROBE AMP () NOT AT [facility]     EKG None  Radiology No results found.  Procedures Procedures (including critical care time)  Medications Ordered in ED Medications  sodium chloride 0.9 % bolus 1,000 mL (0 mLs Intravenous Stopped 02/20/18 0300)  ketorolac (TORADOL) 30 MG/ML injection 30 mg (30 mg Intravenous Given 02/20/18 0114)     3:24 AM Repeat abdominal exam is stable. No focal TTP. No guarding. Patient states that she is feeling better after IVF and Toradol.  Her laboratory work-up is reassuring without leukocytosis or electrolyte derangements.  Liver and kidney function preserved.  Urinalysis does not suggest UTI.  Pregnancy negative.  She presented for similar abdominal pain 3 months ago and underwent pelvic ultrasound and CT abdomen and pelvis.  These were noncontributory and without evidence of acute process.  In the setting of similar pain with reassuring labs and stable repeat abdominal exam, I do not believe further emergent work-up is indicated.  Patient agreeable to plan of discharge with return if symptoms worsen.   Initial Impression / Assessment and Plan / ED Course  I have reviewed the triage vital signs and the nursing notes.  Pertinent labs & imaging results that were available during my care of the patient were reviewed by me and considered in my medical decision making (see chart for details).     Patient is nontoxic, nonseptic appearing, in no apparent distress.  Patient's pain and other symptoms adequately managed in emergency department.  Fluid bolus given.  Labs, prior imaging and vitals reviewed.  Patient does not meet the SIRS or Sepsis criteria.  On repeat exam patient does not have a surgical abdomen and there are no peritoneal signs.  No indication of appendicitis, bowel obstruction, bowel perforation, cholecystitis, diverticulitis, PID or ectopic pregnancy.  Patient discharged home with symptomatic treatment and given strict instructions for follow-up with their primary care  physician.  I have also discussed reasons to return immediately to the ER.  Patient expresses understanding and agrees with plan.   Vitals:   02/20/18 0051 02/20/18 0052 02/20/18 0100 02/20/18 0405  BP: 125/72 125/72 118/89 (!) 146/74  Pulse: (!) 101 (!) 114 (!) 110 96  Resp:  19 18 19   Temp:      TempSrc:  Oral    SpO2: 100% 100% 98% 100%  Weight:      Height:        Final Clinical Impressions(s) / ED Diagnoses   Final diagnoses:  Generalized abdominal pain    ED Discharge Orders         Ordered    dicyclomine (BENTYL) 20 MG tablet  Every 12 hours PRN     02/20/18 0318    ibuprofen (ADVIL,MOTRIN) 600 MG tablet  Every 6 hours PRN     02/20/18 0318           Antony MaduraHumes, Devlin Brink, PA-C 02/20/18 [id number]    Geoffery Lyonselo, Douglas, MD 02/20/18 [phone]

## 2018-03-25 ENCOUNTER — Encounter (HOSPITAL_COMMUNITY): Payer: Self-pay

## 2018-03-25 ENCOUNTER — Emergency Department (HOSPITAL_COMMUNITY): Payer: Self-pay

## 2018-03-25 ENCOUNTER — Emergency Department (HOSPITAL_COMMUNITY)
Admission: EM | Admit: 2018-03-25 | Discharge: 2018-03-25 | Disposition: A | Discharge status: Home / Self Care | Payer: Self-pay | Attending: Emergency Medicine | Admitting: Emergency Medicine

## 2018-03-25 ENCOUNTER — Other Ambulatory Visit: Payer: Self-pay

## 2018-03-25 DIAGNOSIS — Z79899 Other long term (current) drug therapy: Secondary | ICD-10-CM | POA: Insufficient documentation

## 2018-03-25 DIAGNOSIS — M722 Plantar fascial fibromatosis: Secondary | ICD-10-CM | POA: Insufficient documentation

## 2018-03-25 MED ORDER — TRAMADOL HCL 50 MG PO TABS: 50.0000 mg | ORAL_TABLET | Freq: Four times a day (QID) | ORAL | 0 refills | Status: DC | PRN

## 2018-03-25 MED ORDER — PREDNISONE 50 MG PO TABS: 50.0000 mg | ORAL_TABLET | Freq: Every day | ORAL | 0 refills | Status: DC

## 2018-03-25 NOTE — ED Triage Notes (Signed)
Pt reports right leg pain from her knee down to her foot for 3 days. Pain increases with ambulation.

## 2018-03-25 NOTE — ED Notes (Signed)
Patient verbalizes understanding of discharge instructions. Opportunity for questioning and answers were provided. Armband removed by staff, pt discharged from ED.  

## 2018-03-25 NOTE — Discharge Instructions (Signed)
Return here as needed.  Use ice and heat on your leg and foot.  X-rays did not show any abnormalities at this time.  Follow-up with the orthopedist as needed.

## 2018-03-28 NOTE — ED Provider Notes (Signed)
[facility] EMERGENCY DEPARTMENT Provider Note   CSN: [id number] Arrival date & time: 03/25/18  2012     History   Chief Complaint Chief Complaint  Patient presents with  . Leg Pain    HPI Julie Huber is a 23 y.o. female.  HPI Patient presents to the emergency department with right foot pain that radiates up her leg.  The patient states this started while she was working several days ago.  The patient states that she does a lot of walking and standing at work.  Patient states that the bottom of her foot is mostly at the area of pain.  She states that she does have some anterior leg pain but mostly the bottom of her foot.  Patient denies any other injuries.  She states she did not take any medications prior to arrival for symptoms. Past Medical History:  Diagnosis Date  . Back pain     There are no active problems to display for this patient.   Past Surgical History:  Procedure Laterality Date  . TONSILLECTOMY       OB History   No obstetric history on file.      Home Medications    Prior to Admission medications   Medication Sig Start Date End Date Taking? Authorizing Provider  acetaminophen (TYLENOL) 500 MG tablet Take 1,000 mg by mouth every 6 (six) hours as needed for mild pain.    [provider]  cyclobenzaprine (FLEXERIL) 10 MG tablet Take 10 mg by mouth daily as needed for muscle spasms.    [provider]  dicyclomine (BENTYL) 20 MG tablet Take 1 tablet (20 mg total) by mouth every 12 (twelve) hours as needed (for abdominal pain/cramping). 02/20/18   Antony Madura, PA-C  ferrous sulfate 325 (65 FE) MG tablet Take 325 mg by mouth daily after breakfast.    [provider]  ibuprofen (ADVIL,MOTRIN) 600 MG tablet Take 1 tablet (600 mg total) by mouth every 6 (six) hours as needed for mild pain or moderate pain. 02/20/18   Antony Madura, PA-C  methocarbamol (ROBAXIN) 500 MG tablet Take 1 tablet (500 mg total) by  mouth 2 (two) times daily. Patient not taking: Reported on 11/26/2017 11/13/17   Janne Napoleon, NP  naproxen (NAPROSYN) 500 MG tablet Take 1 tablet (500 mg total) by mouth 2 (two) times daily. Patient taking differently: Take 500 mg by mouth 2 (two) times daily as needed (pain).  11/13/17   Janne Napoleon, NP  predniSONE (DELTASONE) 50 MG tablet Take 1 tablet (50 mg total) by mouth daily. 03/25/18   Khari Lett, Cristal Deer, PA-C  ranitidine (ZANTAC) 150 MG tablet Take 1 tablet (150 mg total) by mouth 2 (two) times daily. Patient not taking: Reported on 11/26/2017 10/30/17   Gilda Crease, MD  traMADol (ULTRAM) 50 MG tablet Take 1 tablet (50 mg total) by mouth every 6 (six) hours as needed for severe pain. 03/25/18   Charlestine Night, PA-C    Family History No family history on file.  Social History Social History   Tobacco Use  . Smoking status: Never Smoker  . Smokeless tobacco: Never Used  Substance Use Topics  . Alcohol use: Not Currently  . Drug use: Never     Allergies   Penicillins   Review of Systems Review of Systems  All other systems negative except as documented in the HPI. All pertinent positives and negatives as reviewed in the HPI. Physical Exam Updated Vital Signs BP  130/61 (BP Location: Right Arm)   Pulse 92   Temp 97.9 F (36.6 C) (Oral)   Resp 16   SpO2 100%   Physical Exam Vitals signs and nursing note reviewed.  Constitutional:      General: She is not in acute distress.    Appearance: She is well-developed.  HENT:     Head: Normocephalic and atraumatic.  Eyes:     Pupils: Pupils are equal, round, and reactive to light.  Pulmonary:     Effort: Pulmonary effort is normal.  Musculoskeletal:       Feet:  Skin:    General: Skin is warm and dry.  Neurological:     Mental Status: She is alert and oriented to person, place, and time.      ED Treatments / Results  Labs (all labs ordered are listed, but only abnormal results are  displayed) Labs Reviewed - No data to display  EKG None  Radiology No results found.  Procedures Procedures (including critical care time)  Medications Ordered in ED Medications - No data to display   Initial Impression / Assessment and Plan / ED Course  I have reviewed the triage vital signs and the nursing notes.  Pertinent labs & imaging results that were available during my care of the patient were reviewed by me and considered in my medical decision making (see chart for details).    Patient be treated for plantar fasciitis and given Ortho referral.  I did advise her to ice and stretch her foot.  Patient is advised to return here as needed patient agrees the plan and all questions were answered. Final Clinical Impressions(s) / ED Diagnoses   Final diagnoses:  Plantar fasciitis    ED Discharge Orders         Ordered    predniSONE (DELTASONE) 50 MG tablet  Daily     03/25/18 2254    traMADol (ULTRAM) 50 MG tablet  Every 6 hours PRN     03/25/18 2254           Charlestine Night, PA-C 03/28/18 2349    Arby Barrette, MD 03/29/18 2001

## 2018-04-05 ENCOUNTER — Emergency Department (HOSPITAL_COMMUNITY): Payer: Self-pay

## 2018-04-05 ENCOUNTER — Emergency Department (HOSPITAL_COMMUNITY)
Admission: EM | Admit: 2018-04-05 | Discharge: 2018-04-05 | Disposition: A | Discharge status: Home / Self Care | Payer: Self-pay | Attending: Emergency Medicine | Admitting: Emergency Medicine

## 2018-04-05 ENCOUNTER — Encounter (HOSPITAL_COMMUNITY): Payer: Self-pay | Admitting: Emergency Medicine

## 2018-04-05 DIAGNOSIS — R102 Pelvic and perineal pain: Secondary | ICD-10-CM | POA: Insufficient documentation

## 2018-04-05 DIAGNOSIS — Z79899 Other long term (current) drug therapy: Secondary | ICD-10-CM | POA: Insufficient documentation

## 2018-04-05 DIAGNOSIS — R110 Nausea: Secondary | ICD-10-CM | POA: Insufficient documentation

## 2018-04-05 LAB — CBC WITH DIFFERENTIAL/PLATELET
Abs Immature Granulocytes: 0.1 10*3/uL — ABNORMAL HIGH (ref 0.00–0.07)
Basophils Absolute: 0 10*3/uL (ref 0.0–0.1)
Basophils Relative: 0 %
Eosinophils Absolute: 0.2 10*3/uL (ref 0.0–0.5)
Eosinophils Relative: 1 %
HCT: 40 % (ref 36.0–46.0)
Hemoglobin: 12.3 g/dL (ref 12.0–15.0)
Immature Granulocytes: 1 %
Lymphocytes Relative: 32 %
Lymphs Abs: 4.5 10*3/uL — ABNORMAL HIGH (ref 0.7–4.0)
MCH: 25 pg — ABNORMAL LOW (ref 26.0–34.0)
MCHC: 30.8 g/dL (ref 30.0–36.0)
MCV: 81.3 fL (ref 80.0–100.0)
Monocytes Absolute: 1.2 10*3/uL — ABNORMAL HIGH (ref 0.1–1.0)
Monocytes Relative: 9 %
Neutro Abs: 8 10*3/uL — ABNORMAL HIGH (ref 1.7–7.7)
Neutrophils Relative %: 57 %
Platelets: 345 10*3/uL (ref 150–400)
RBC: 4.92 MIL/uL (ref 3.87–5.11)
RDW: 13.8 % (ref 11.5–15.5)
WBC: 14.1 10*3/uL — ABNORMAL HIGH (ref 4.0–10.5)
nRBC: 0 % (ref 0.0–0.2)

## 2018-04-05 LAB — URINALYSIS, ROUTINE W REFLEX MICROSCOPIC
Bilirubin Urine: NEGATIVE
Glucose, UA: NEGATIVE mg/dL
Hgb urine dipstick: NEGATIVE
Ketones, ur: NEGATIVE mg/dL
Leukocytes, UA: NEGATIVE
Nitrite: NEGATIVE
Protein, ur: NEGATIVE mg/dL
Specific Gravity, Urine: 1.028 (ref 1.005–1.030)
pH: 5 (ref 5.0–8.0)

## 2018-04-05 LAB — COMPREHENSIVE METABOLIC PANEL
ALT: 23 U/L (ref 0–44)
AST: 19 U/L (ref 15–41)
Albumin: 3.4 g/dL — ABNORMAL LOW (ref 3.5–5.0)
Alkaline Phosphatase: 70 U/L (ref 38–126)
Anion gap: 11 (ref 5–15)
BUN: 12 mg/dL (ref 6–20)
CO2: 29 mmol/L (ref 22–32)
Calcium: 9.1 mg/dL (ref 8.9–10.3)
Chloride: 102 mmol/L (ref 98–111)
Creatinine, Ser: 0.72 mg/dL (ref 0.44–1.00)
GFR calc Af Amer: >60 mL/min (ref >60)
GFR calc non Af Amer: >60 mL/min (ref >60)
Glucose, Bld: 95 mg/dL (ref 70–99)
Potassium: 3.7 mmol/L (ref 3.5–5.1)
Sodium: 142 mmol/L (ref 135–145)
Total Bilirubin: 0.9 mg/dL (ref 0.3–1.2)
Total Protein: 6.9 g/dL (ref 6.5–8.1)

## 2018-04-05 LAB — POC URINE PREG, ED: Preg Test, Ur: NEGATIVE

## 2018-04-05 LAB — WET PREP, GENITAL
Clue Cells Wet Prep HPF POC: NONE SEEN
Sperm: NONE SEEN
Trich, Wet Prep: NONE SEEN
Yeast Wet Prep HPF POC: NONE SEEN

## 2018-04-05 LAB — LIPASE, BLOOD: Lipase: 22 U/L (ref 11–51)

## 2018-04-05 MED ORDER — IOHEXOL 300 MG/ML  SOLN
100.0000 mL | Freq: Once | INTRAMUSCULAR | Status: AC | PRN
  Administered 2018-04-05: 100 mL via INTRAVENOUS

## 2018-04-05 MED ORDER — OXYCODONE-ACETAMINOPHEN 5-325 MG PO TABS
1.0000 | ORAL_TABLET | Freq: Once | ORAL | Status: AC
  Administered 2018-04-05: 1 via ORAL
  Filled 2018-04-05: qty 1

## 2018-04-05 NOTE — ED Provider Notes (Signed)
[facility] EMERGENCY DEPARTMENT Provider Note   CSN: [id number] Arrival date & time: 04/05/18  1404     History   Chief Complaint Chief Complaint  Patient presents with  . Pelvic Pain    HPI Allyssia Sisto is a 23 y.o. female.  HPI   23 year old female presents today with complaints of pelvic pain.  Patient notes that she was using the restroom this morning when she felt a sharp pain in her suprapubic and pelvic region.  Patient notes this was severe in nature and has persisted.  She denies any burning with urination, denies any change in the color clarity or characteristics of her urine.  She denies any associated vaginal bleeding or discharge.  She denies any upper abdominal pain, she notes she was nauseous but did not vomit.  Normal bowel movements.  No fevers.  Patient reports she is sexually active.  She notes taking a tramadol today that did not improve her symptoms.  Past Medical History:  Diagnosis Date  . Back pain     There are no active problems to display for this patient.   Past Surgical History:  Procedure Laterality Date  . TONSILLECTOMY       OB History   No obstetric history on file.      Home Medications    Prior to Admission medications   Medication Sig Start Date End Date Taking? Authorizing Provider  acetaminophen (TYLENOL) 500 MG tablet Take 1,000 mg by mouth every 6 (six) hours as needed for mild pain.    [provider]  cyclobenzaprine (FLEXERIL) 10 MG tablet Take 10 mg by mouth daily as needed for muscle spasms.    [provider]  dicyclomine (BENTYL) 20 MG tablet Take 1 tablet (20 mg total) by mouth every 12 (twelve) hours as needed (for abdominal pain/cramping). 02/20/18   Antony Madura, PA-C  ferrous sulfate 325 (65 FE) MG tablet Take 325 mg by mouth daily after breakfast.    [provider]  ibuprofen (ADVIL,MOTRIN) 600 MG tablet Take 1 tablet (600 mg total) by mouth every 6 (six) hours  as needed for mild pain or moderate pain. 02/20/18   Antony Madura, PA-C  methocarbamol (ROBAXIN) 500 MG tablet Take 1 tablet (500 mg total) by mouth 2 (two) times daily. Patient not taking: Reported on 11/26/2017 11/13/17   Janne Napoleon, NP  naproxen (NAPROSYN) 500 MG tablet Take 1 tablet (500 mg total) by mouth 2 (two) times daily. Patient taking differently: Take 500 mg by mouth 2 (two) times daily as needed (pain).  11/13/17   Janne Napoleon, NP  predniSONE (DELTASONE) 50 MG tablet Take 1 tablet (50 mg total) by mouth daily. 03/25/18   Lawyer, Cristal Deer, PA-C  ranitidine (ZANTAC) 150 MG tablet Take 1 tablet (150 mg total) by mouth 2 (two) times daily. Patient not taking: Reported on 11/26/2017 10/30/17   Gilda Crease, MD  traMADol (ULTRAM) 50 MG tablet Take 1 tablet (50 mg total) by mouth every 6 (six) hours as needed for severe pain. 03/25/18   Charlestine Night, PA-C    Family History No family history on file.  Social History Social History   Tobacco Use  . Smoking status: Never Smoker  . Smokeless tobacco: Never Used  Substance Use Topics  . Alcohol use: Not Currently  . Drug use: Never    Allergies   Penicillins   Review of Systems Review of Systems  All other systems reviewed and are negative.  Physical Exam Updated Vital Signs BP 120/62 (BP Location: Right Arm)   Pulse 80   Temp 98.2 F (36.8 C) (Oral)   Resp 16   SpO2 100%   Physical Exam Vitals signs and nursing note reviewed.  Constitutional:      Appearance: She is well-developed.     Comments: Morbidly obese  HENT:     Head: Normocephalic and atraumatic.  Eyes:     General: No scleral icterus.       Right eye: No discharge.        Left eye: No discharge.     Conjunctiva/sclera: Conjunctivae normal.     Pupils: Pupils are equal, round, and reactive to light.  Neck:     Musculoskeletal: Normal range of motion.     Vascular: No JVD.     Trachea: No tracheal deviation.  Pulmonary:      Effort: Pulmonary effort is normal.     Breath sounds: No stridor.  Abdominal:     Comments: Difficult exam due to body habitus-tenderness along the lower abdomen and periumbilical region, nonsevere  Genitourinary:    Comments: No vaginal discharge, no cervical motion tenderness Neurological:     Mental Status: She is alert and oriented to person, place, and time.     Coordination: Coordination normal.  Psychiatric:        Behavior: Behavior normal.        Thought Content: Thought content normal.        Judgment: Judgment normal.      ED Treatments / Results  Labs (all labs ordered are listed, but only abnormal results are displayed) Labs Reviewed  CBC WITH DIFFERENTIAL/PLATELET - Abnormal; Notable for the following components:      Result Value   WBC 14.1 (*)    MCH 25.0 (*)    Neutro Abs 8.0 (*)    Lymphs Abs 4.5 (*)    Monocytes Absolute 1.2 (*)    Abs Immature Granulocytes 0.10 (*)    All other components within normal limits  URINALYSIS, ROUTINE W REFLEX MICROSCOPIC - Abnormal; Notable for the following components:   APPearance HAZY (*)    All other components within normal limits  WET PREP, GENITAL  COMPREHENSIVE METABOLIC PANEL  LIPASE, BLOOD  POC URINE PREG, ED  GC/CHLAMYDIA PROBE AMP ([place]) NOT AT [facility]    EKG None  Radiology No results found.  Procedures Procedures (including critical care time)  Medications Ordered in ED Medications  oxyCODONE-acetaminophen (PERCOCET/ROXICET) 5-325 MG per tablet 1 tablet (1 tablet Oral Given 04/05/18 1441)     Initial Impression / Assessment and Plan / ED Course  I have reviewed the triage vital signs and the nursing notes.  Pertinent labs & imaging results that were available during my care of the patient were reviewed by me and considered in my medical decision making (see chart for details).     Labs: CBC, CMP, lipase, urinalysis, point-of-care urine pregnant, wet prep, GC  Imaging: Pelvic ultrasound  with trans-vaginal  Consults:  Therapeutics: Percocet  Discharge Meds:   Assessment/Plan: 23 year old female presents today with acute onset pelvic pain.  Patient is smiling and laughing throughout exam she appears to be in no acute distress.  She does have abdominal tenderness although this is very difficult to localize secondary to her morbid obesity.  Her pelvic exam was reassuring with no discharge or cervical motion tenderness low suspicion for PID.  Her urine is reassuring with no signs of urinary tract infection.  Patient will need pelvic Doppler to rule out torsion.  Patient care signed to oncoming provider pending results and disposition.   Final Clinical Impressions(s) / ED Diagnoses   Final diagnoses:  Pelvic pain in female    ED Discharge Orders    None       Rosalio LoudHedges, Vyctoria Dickman, PA-C 04/05/18 1525    Linwood DibblesKnapp, Jon, MD 04/05/18 [phone]

## 2018-04-05 NOTE — Discharge Instructions (Addendum)
Work-up in the ER was overall reassuring.  Your labs show that your white blood cell count was a bit high, this can happen for a variety of reasons, this should be rechecked by her primary care provider.  Your CT scan and ultrasound did not show any abnormalities within your abdomen or pelvis.  The CT scan did show that your heart is mildly enlarged, this is something to discuss with primary care for further evaluation.  Please follow-up with primary care within 1 week.  Take Tylenol for increased discomfort.  Return to the ER for new or worsening symptoms or any other concerns.

## 2018-04-05 NOTE — ED Triage Notes (Signed)
Pt arrives with c/o of pelvic pain-denies vaginal discharge. Pt denies any urinary sxs.

## 2018-04-05 NOTE — ED Provider Notes (Signed)
16:00: Assumed care of patient @ change of shift from Mohawk IndustriesJeff Hedges PA-C pending US- if US negative will likely require CT abdomen/pelvis.   Please see prior provider note for full H&P.   Briefly patient is a 23 year old female who presented to the ER w/ pelvic pain, some associated nausea, no vomiting.   Physical Exam  BP 120/62 (BP Location: Right Arm)   Pulse 80   Temp 98.2 F (36.8 C) (Oral)   Resp 16   SpO2 100%   Physical Exam Vitals signs and nursing note reviewed.  Constitutional:      General: She is not in acute distress.    Appearance: She is well-developed.  HENT:     Head: Normocephalic and atraumatic.  Eyes:     General:        Right eye: No discharge.        Left eye: No discharge.     Conjunctiva/sclera: Conjunctivae normal.  Abdominal:     Tenderness: There is abdominal tenderness (lower abdomen). There is no guarding or rebound.     Comments: Difficult exam secondary to body habitus.   Neurological:     Mental Status: She is alert.     Comments: Clear speech.   Psychiatric:        Behavior: Behavior normal.        Thought Content: Thought content normal.     ED Course/Procedures     Results for orders placed or performed during the hospital encounter of 04/05/18  Wet prep, genital  Result Value Ref Range   Yeast Wet Prep HPF POC NONE SEEN NONE SEEN   Trich, Wet Prep NONE SEEN NONE SEEN   Clue Cells Wet Prep HPF POC NONE SEEN NONE SEEN   WBC, Wet Prep HPF POC MODERATE (A) NONE SEEN   Sperm NONE SEEN   CBC with Differential  Result Value Ref Range   WBC 14.1 (H) 4.0 - 10.5 K/uL   RBC 4.92 3.87 - 5.11 MIL/uL   Hemoglobin 12.3 12.0 - 15.0 g/dL   HCT 16.140.0 09.636.0 - 04.546.0 %   MCV 81.3 80.0 - 100.0 fL   MCH 25.0 (L) 26.0 - 34.0 pg   MCHC 30.8 30.0 - 36.0 g/dL   RDW 40.913.8 81.111.5 - 91.415.5 %   Platelets 345 150 - 400 K/uL   nRBC 0.0 0.0 - 0.2 %   Neutrophils Relative % 57 %   Neutro Abs 8.0 (H) 1.7 - 7.7 K/uL   Lymphocytes Relative 32 %   Lymphs Abs 4.5 (H)  0.7 - 4.0 K/uL   Monocytes Relative 9 %   Monocytes Absolute 1.2 (H) 0.1 - 1.0 K/uL   Eosinophils Relative 1 %   Eosinophils Absolute 0.2 0.0 - 0.5 K/uL   Basophils Relative 0 %   Basophils Absolute 0.0 0.0 - 0.1 K/uL   Immature Granulocytes 1 %   Abs Immature Granulocytes 0.10 (H) 0.00 - 0.07 K/uL  Comprehensive metabolic panel  Result Value Ref Range   Sodium 142 135 - 145 mmol/L   Potassium 3.7 3.5 - 5.1 mmol/L   Chloride 102 98 - 111 mmol/L   CO2 29 22 - 32 mmol/L   Glucose, Bld 95 70 - 99 mg/dL   BUN 12 6 - 20 mg/dL   Creatinine, Ser 7.820.72 0.44 - 1.00 mg/dL   Calcium 9.1 8.9 - 95.610.3 mg/dL   Total Protein 6.9 6.5 - 8.1 g/dL   Albumin 3.4 (L) 3.5 - 5.0 g/dL  AST 19 15 - 41 U/L   ALT 23 0 - 44 U/L   Alkaline Phosphatase 70 38 - 126 U/L   Total Bilirubin 0.9 0.3 - 1.2 mg/dL   GFR calc non Af Amer >60 >60 mL/min   GFR calc Af Amer >60 >60 mL/min   Anion gap 11 5 - 15  Lipase, blood  Result Value Ref Range   Lipase 22 11 - 51 U/L  Urinalysis, Routine w reflex microscopic  Result Value Ref Range   Color, Urine YELLOW YELLOW   APPearance HAZY (A) CLEAR   Specific Gravity, Urine 1.028 1.005 - 1.030   pH 5.0 5.0 - 8.0   Glucose, UA NEGATIVE NEGATIVE mg/dL   Hgb urine dipstick NEGATIVE NEGATIVE   Bilirubin Urine NEGATIVE NEGATIVE   Ketones, ur NEGATIVE NEGATIVE mg/dL   Protein, ur NEGATIVE NEGATIVE mg/dL   Nitrite NEGATIVE NEGATIVE   Leukocytes, UA NEGATIVE NEGATIVE  POC Urine Pregnancy, ED ([facility])  Result Value Ref Range   Preg Test, Ur NEGATIVE NEGATIVE   [place] Transvaginal Non-ob  Result Date: 04/05/2018 CLINICAL DATA:  Acute pelvic pain EXAM: TRANSABDOMINAL AND TRANSVAGINAL ULTRASOUND OF PELVIS DOPPLER ULTRASOUND OF OVARIES TECHNIQUE: Both transabdominal and transvaginal ultrasound examinations of the pelvis were performed. Transabdominal technique was performed for global imaging of the pelvis including uterus, ovaries, adnexal regions, and pelvic cul-de-sac.  It was necessary to proceed with endovaginal exam following the transabdominal exam to visualize the uterus and ovaries. Color and duplex Doppler ultrasound was utilized to evaluate blood flow to the ovaries. COMPARISON:  11/26/2017 FINDINGS: Uterus Measurements: 7.2 x 3.0 x 4.3 cm = volume: 48 mL. Nabothian cysts. No focal uterine mass. Endometrium Thickness: 5 mm, normal.  No endometrial fluid or focal abnormality Right ovary Measurements: 2.6 x 1.9 x 1.9 cm = volume: 4.9 mL. Normal morphology without mass. Internal blood flow present on color Doppler imaging. Left ovary Measurements: 2.9 x 2.1 x 2.9 cm = volume: 9.3 mL. Normal morphology without mass. Internal blood flow present on color Doppler imaging. Pulsed Doppler evaluation of both ovaries demonstrates normal low-resistance arterial and venous waveforms. Other findings No free pelvic fluid.  No adnexal masses. IMPRESSION: Normal exam. Electronically Signed   By: Ulyses SouthwardMark  Boles M.D.   On: 04/05/2018 16:29   [place] Pelvis Complete  Result Date: 04/05/2018 CLINICAL DATA:  Acute pelvic pain EXAM: TRANSABDOMINAL AND TRANSVAGINAL ULTRASOUND OF PELVIS DOPPLER ULTRASOUND OF OVARIES TECHNIQUE: Both transabdominal and transvaginal ultrasound examinations of the pelvis were performed. Transabdominal technique was performed for global imaging of the pelvis including uterus, ovaries, adnexal regions, and pelvic cul-de-sac. It was necessary to proceed with endovaginal exam following the transabdominal exam to visualize the uterus and ovaries. Color and duplex Doppler ultrasound was utilized to evaluate blood flow to the ovaries. COMPARISON:  11/26/2017 FINDINGS: Uterus Measurements: 7.2 x 3.0 x 4.3 cm = volume: 48 mL. Nabothian cysts. No focal uterine mass. Endometrium Thickness: 5 mm, normal.  No endometrial fluid or focal abnormality Right ovary Measurements: 2.6 x 1.9 x 1.9 cm = volume: 4.9 mL. Normal morphology without mass. Internal blood flow present on color Doppler  imaging. Left ovary Measurements: 2.9 x 2.1 x 2.9 cm = volume: 9.3 mL. Normal morphology without mass. Internal blood flow present on color Doppler imaging. Pulsed Doppler evaluation of both ovaries demonstrates normal low-resistance arterial and venous waveforms. Other findings No free pelvic fluid.  No adnexal masses. IMPRESSION: Normal exam. Electronically Signed   By: Loraine LericheMark  Tyron Russell M.D.   On: 04/05/2018 16:29   Ct Abdomen Pelvis W Contrast  Result Date: 04/05/2018 CLINICAL DATA:  23 year old female with pelvic pain for 2 days. EXAM: CT ABDOMEN AND PELVIS WITH CONTRAST TECHNIQUE: Multidetector CT imaging of the abdomen and pelvis was performed using the standard protocol following bolus administration of intravenous contrast. CONTRAST:  OMNIPAQUE IOHEXOL 300 MG/ML  SOLN COMPARISON:  Pelvis ultrasound earlier today. Noncontrast CT Abdomen and Pelvis 11/26/2017. FINDINGS: Lower chest: Borderline to mild cardiomegaly. No pericardial effusion. Normal lung bases. Hepatobiliary: Negative noncontrast liver and gallbladder. Pancreas: Negative. Spleen: Negative. Adrenals/Urinary Tract: Normal adrenal glands. Normal noncontrast kidneys. Decompressed and normal ureters. No urinary calculus identified. Unremarkable urinary bladder. Stomach/Bowel: Negative large bowel. Normal appendix (coronal image 72). Negative terminal ileum. No dilated small bowel. Negative stomach and duodenum. No free air, free fluid. Vascular/Lymphatic: Vascular patency is not evaluated in the absence of IV contrast. No lymphadenopathy. Reproductive: Negative noncontrast appearance on series 3, image 78. Other: Large body habitus. No pelvic free fluid. Musculoskeletal: Degenerative vacuum phenomena in the SI joints. No acute osseous abnormality identified. IMPRESSION: 1. No acute or inflammatory process identified in the noncontrast abdomen or pelvis. 2. Mild cardiomegaly. Electronically Signed   By: Odessa Fleming M.D.   On: 04/05/2018 18:47   [place]  Art/ven Flow Abd Pelv Doppler  Result Date: 04/05/2018 CLINICAL DATA:  Acute pelvic pain EXAM: TRANSABDOMINAL AND TRANSVAGINAL ULTRASOUND OF PELVIS DOPPLER ULTRASOUND OF OVARIES TECHNIQUE: Both transabdominal and transvaginal ultrasound examinations of the pelvis were performed. Transabdominal technique was performed for global imaging of the pelvis including uterus, ovaries, adnexal regions, and pelvic cul-de-sac. It was necessary to proceed with endovaginal exam following the transabdominal exam to visualize the uterus and ovaries. Color and duplex Doppler ultrasound was utilized to evaluate blood flow to the ovaries. COMPARISON:  11/26/2017 FINDINGS: Uterus Measurements: 7.2 x 3.0 x 4.3 cm = volume: 48 mL. Nabothian cysts. No focal uterine mass. Endometrium Thickness: 5 mm, normal.  No endometrial fluid or focal abnormality Right ovary Measurements: 2.6 x 1.9 x 1.9 cm = volume: 4.9 mL. Normal morphology without mass. Internal blood flow present on color Doppler imaging. Left ovary Measurements: 2.9 x 2.1 x 2.9 cm = volume: 9.3 mL. Normal morphology without mass. Internal blood flow present on color Doppler imaging. Pulsed Doppler evaluation of both ovaries demonstrates normal low-resistance arterial and venous waveforms. Other findings No free pelvic fluid.  No adnexal masses. IMPRESSION: Normal exam. Electronically Signed   By: Ulyses Southward M.D.   On: 04/05/2018 16:29   Dg Foot Complete Right  Result Date: 03/25/2018 CLINICAL DATA:  Right foot pain for 3 days.  No known injury. EXAM: RIGHT FOOT COMPLETE - 3+ VIEW COMPARISON:  None. FINDINGS: There is no evidence of fracture or dislocation. There is no evidence of arthropathy or other focal bone abnormality. Soft tissues are unremarkable. IMPRESSION: Negative. Electronically Signed   By: Narda Rutherford M.D.   On: 03/25/2018 22:49    Procedures  MDM   Work up reviewed:  CBC: Leukocytosis at 14.1 w/ left shift. No anemia CMP: Fairly unremarkable,  midly low albumin. Electrolytes, LFTs & renal function WNL Lipase: WNL UA: No UTI Wet prep: No BV, trich, or yeast. Moderate WBCs- pelvic performed by prior provider- no notable discharge or CMT to suggest PID, GC/chlamydia pending  Ultrasound unremarkable.  Patient remains with reported discomfort on reassessment, abdomen with some mild lower abdominal tenderness without peritoneal signs, patient giggling/laughing throughout assessment, discussed risks/benefits of  CT, she would like to proceed with CT imaging.   CT obtained and negative for acute or abdominal/intrapelvic process..  Patient does have some mild cardiomegaly to require outpatient evaluation.  She has remained well-appearing throughout her ER visit, she has not had any emesis in the department, she is been able to tolerate p.o.  She remains without peritoneal signs on abdominal exam.  She appears safe for discharge home with primary care follow-up. I discussed results, treatment plan, need for follow-up, and return precautions with the patient. Provided opportunity for questions, patient confirmed understanding and is in agreement with plan.     Desmond Lope 04/05/18 Glorious Peach, MD 04/06/18 [phone]

## 2018-04-06 LAB — GC/CHLAMYDIA PROBE AMP (~~LOC~~) NOT AT ARMC
Chlamydia: NEGATIVE
Neisseria Gonorrhea: NEGATIVE

## 2018-04-22 ENCOUNTER — Encounter (HOSPITAL_COMMUNITY): Payer: Self-pay

## 2018-04-22 ENCOUNTER — Emergency Department (HOSPITAL_COMMUNITY)
Admission: EM | Admit: 2018-04-22 | Discharge: 2018-04-23 | Disposition: A | Discharge status: Home / Self Care | Payer: Self-pay | Attending: Emergency Medicine | Admitting: Emergency Medicine

## 2018-04-22 DIAGNOSIS — M25532 Pain in left wrist: Secondary | ICD-10-CM | POA: Insufficient documentation

## 2018-04-22 DIAGNOSIS — R05 Cough: Secondary | ICD-10-CM | POA: Insufficient documentation

## 2018-04-22 NOTE — ED Triage Notes (Signed)
Pt states that she hurt her  L wrist while at work 4 days ago lifting something and she has had a productive cough for the past two days with no fevers.

## 2018-04-23 ENCOUNTER — Emergency Department (HOSPITAL_COMMUNITY): Payer: Self-pay

## 2018-04-23 NOTE — Discharge Instructions (Addendum)
You may alternate tylenol or ibuprofen for your pain, please apply ICE to the area. I have also provided a referral to Orthopedist, please schedule an appointment as needed.

## 2018-04-23 NOTE — ED Notes (Signed)
Patient is A&Ox4.  No signs of distress noted.  Please see providers complete history and physical exam.  

## 2018-04-23 NOTE — ED Provider Notes (Signed)
[facility] EMERGENCY DEPARTMENT Provider Note   CSN: [id number] Arrival date & time: 04/22/18  2333    History   Chief Complaint Chief Complaint  Patient presents with  . Wrist Pain  . Cough    HPI Julie Huber is a 23 y.o. female.     23 y.o female with no PMH presents to the ED with a chief complaint of left wrist pain x 4 days.  Patient reports 4 days ago she was lifting a car battery when she felt her wrist pop, she reports immediate pain to the left wrist worse with hand flexion.  She reports taking tramadol along with ibuprofen for the pain but reports this is not helping with the symptoms.  Patient denies applying any ice or heat to the hand.  She also endorses a cough which is productive and only happens during the morning, she does report she snores and states her mouth is very dry during the mornings.  This seems to resolve throughout the day.  She denies any fevers, chills, shortness of breath or chest pain.     Past Medical History:  Diagnosis Date  . Back pain     There are no active problems to display for this patient.   Past Surgical History:  Procedure Laterality Date  . TONSILLECTOMY       OB History   No obstetric history on file.      Home Medications    Prior to Admission medications   Medication Sig Start Date End Date Taking? Authorizing Provider  acetaminophen (TYLENOL) 500 MG tablet Take 1,000 mg by mouth every 6 (six) hours as needed for mild pain.    [provider]  cyclobenzaprine (FLEXERIL) 10 MG tablet Take 10 mg by mouth daily as needed for muscle spasms.    [provider]  dicyclomine (BENTYL) 20 MG tablet Take 1 tablet (20 mg total) by mouth every 12 (twelve) hours as needed (for abdominal pain/cramping). 02/20/18   Antony Madura, PA-C  ferrous sulfate 325 (65 FE) MG tablet Take 325 mg by mouth daily after breakfast.    [provider]  ibuprofen (ADVIL,MOTRIN) 600 MG tablet  Take 1 tablet (600 mg total) by mouth every 6 (six) hours as needed for mild pain or moderate pain. 02/20/18   Antony Madura, PA-C  methocarbamol (ROBAXIN) 500 MG tablet Take 1 tablet (500 mg total) by mouth 2 (two) times daily. Patient not taking: Reported on 11/26/2017 11/13/17   Janne Napoleon, NP  naproxen (NAPROSYN) 500 MG tablet Take 1 tablet (500 mg total) by mouth 2 (two) times daily. Patient taking differently: Take 500 mg by mouth 2 (two) times daily as needed (pain).  11/13/17   Janne Napoleon, NP  predniSONE (DELTASONE) 50 MG tablet Take 1 tablet (50 mg total) by mouth daily. 03/25/18   Lawyer, Cristal Deer, PA-C  ranitidine (ZANTAC) 150 MG tablet Take 1 tablet (150 mg total) by mouth 2 (two) times daily. Patient not taking: Reported on 11/26/2017 10/30/17   Gilda Crease, MD  traMADol (ULTRAM) 50 MG tablet Take 1 tablet (50 mg total) by mouth every 6 (six) hours as needed for severe pain. 03/25/18   Charlestine Night, PA-C    Family History No family history on file.  Social History Social History   Tobacco Use  . Smoking status: Never Smoker  . Smokeless tobacco: Never Used  Substance Use Topics  . Alcohol use: Not Currently  . Drug use: Never  Allergies   Penicillins   Review of Systems Review of Systems  Constitutional: Negative for fever.  HENT: Negative for sore throat.   Respiratory: Positive for cough.   Musculoskeletal: Positive for arthralgias.     Physical Exam Updated Vital Signs BP (!) 150/94 (BP Location: Right Arm)   Pulse 99   Temp 98.9 F (37.2 C) (Oral)   Resp 16   SpO2 96%   Physical Exam Vitals signs and nursing note reviewed.  Constitutional:      General: She is not in acute distress.    Appearance: She is well-developed.  HENT:     Head: Normocephalic and atraumatic.     Mouth/Throat:     Pharynx: No oropharyngeal exudate.  Eyes:     Pupils: Pupils are equal, round, and reactive to light.  Neck:     Musculoskeletal:  Normal range of motion.  Cardiovascular:     Rate and Rhythm: Regular rhythm.     Heart sounds: Normal heart sounds.  Pulmonary:     Effort: Pulmonary effort is normal. No respiratory distress.     Breath sounds: Normal breath sounds.  Abdominal:     General: Bowel sounds are normal. There is no distension.     Palpations: Abdomen is soft.     Tenderness: There is no abdominal tenderness.  Musculoskeletal:        General: No tenderness or deformity.     Left wrist: She exhibits normal range of motion, no tenderness and no swelling.       Arms:     Right lower leg: No edema.     Left lower leg: No edema.     Comments: Left wrist with no swelling, tenderness.  Pulses present, capillary refill is intact, no lacerations or abrasions noted to the area.  Skin:    General: Skin is warm and dry.  Neurological:     Mental Status: She is alert and oriented to person, place, and time.      ED Treatments / Results  Labs (all labs ordered are listed, but only abnormal results are displayed) Labs Reviewed - No data to display  EKG None  Radiology Dg Wrist Complete Left  Result Date: 04/23/2018 CLINICAL DATA:  Wrist pain EXAM: LEFT WRIST - COMPLETE 3+ VIEW COMPARISON:  None. FINDINGS: There is no evidence of fracture or dislocation. There is no evidence of arthropathy or other focal bone abnormality. Soft tissues are unremarkable. IMPRESSION: Negative. Electronically Signed   By: Jasmine Pang M.D.   On: 04/23/2018 00:30    Procedures Procedures (including critical care time)  Medications Ordered in ED Medications - No data to display   Initial Impression / Assessment and Plan / ED Course  I have reviewed the triage vital signs and the nursing notes.  Pertinent labs & imaging results that were available during my care of the patient were reviewed by me and considered in my medical decision making (see chart for details).    Patient with no PMH presents with left wrist pain x 4  days. Denies any fever. Reports she had an incident at work. She states feeling a "pop" sensation on her left wrist. She also reports a dry cough in the mornings, states not to be productive. During evaluation she is well appearing in no distress, pulses are equal and symmetric. She is holding phone with both hands. Will place patient on velcro splint, and have her follow up with Orthopedist, will have her alternate ibuprofen or tylenol  for her pain. Return precautions discussed at length.      Final Clinical Impressions(s) / ED Diagnoses   Final diagnoses:  Left wrist pain    ED Discharge Orders    None       Claude Manges, PA-C 04/23/18 0250    Ward, Layla Maw, DO 04/23/18 [phone]

## 2018-04-23 NOTE — ED Notes (Signed)
PT states understanding of care given, follow up care. PT ambulated from ED to car with a steady gait.  

## 2018-05-17 ENCOUNTER — Other Ambulatory Visit: Payer: Self-pay

## 2018-05-17 ENCOUNTER — Encounter (HOSPITAL_COMMUNITY): Payer: Self-pay | Admitting: Emergency Medicine

## 2018-05-17 ENCOUNTER — Emergency Department (HOSPITAL_COMMUNITY)
Admission: EM | Admit: 2018-05-17 | Discharge: 2018-05-17 | Disposition: A | Discharge status: Home / Self Care | Payer: Self-pay | Attending: Emergency Medicine | Admitting: Emergency Medicine

## 2018-05-17 DIAGNOSIS — Z79899 Other long term (current) drug therapy: Secondary | ICD-10-CM | POA: Insufficient documentation

## 2018-05-17 DIAGNOSIS — J029 Acute pharyngitis, unspecified: Secondary | ICD-10-CM | POA: Insufficient documentation

## 2018-05-17 MED ORDER — CEFDINIR 300 MG PO CAPS: 300.0000 mg | ORAL_CAPSULE | Freq: Two times a day (BID) | ORAL | 0 refills | Status: AC

## 2018-05-17 NOTE — Discharge Instructions (Addendum)
Contact a health care provider if:  You have a fever for more than 2-3 days.  You have symptoms that last (are persistent) for more than 2-3 days.  Your throat does not get better within 7 days.  You have a fever and your symptoms suddenly get worse.  Your child who is 3 months to 21 years old has a temperature of 102.2F (39C) or higher.  Get help right away if:  You have difficulty breathing.  You cannot swallow fluids, soft foods, or your saliva.  You have increased swelling in your throat or neck.  You have persistent nausea and vomiting.

## 2018-05-17 NOTE — ED Notes (Signed)
Patient verbalizes understanding of discharge instructions. Opportunity for questioning and answers were provided. Armband removed by staff, pt discharged from ED ambulatory by self\  

## 2018-05-17 NOTE — ED Provider Notes (Addendum)
[facility] EMERGENCY DEPARTMENT Provider Note   CSN: [id number] Arrival date & time: 05/17/18  1757    History   Chief Complaint Chief Complaint  Patient presents with  . Sore Throat    HPI Julie Huber is a 23 y.o. female who presents to the emergency department with chief complaint of sore throat.  Patient had isolated sore throat.  She denies cough.  She denies fevers.  She denies any known sick contacts.  She has a history of strep.  She denies difficulty swallowing or breathing.     HPI  Past Medical History:  Diagnosis Date  . Back pain     There are no active problems to display for this patient.   Past Surgical History:  Procedure Laterality Date  . TONSILLECTOMY       OB History   No obstetric history on file.      Home Medications    Prior to Admission medications   Medication Sig Start Date End Date Taking? Authorizing Provider  acetaminophen (TYLENOL) 500 MG tablet Take 1,000 mg by mouth every 6 (six) hours as needed for mild pain.    [provider]  cyclobenzaprine (FLEXERIL) 10 MG tablet Take 10 mg by mouth daily as needed for muscle spasms.    [provider]  dicyclomine (BENTYL) 20 MG tablet Take 1 tablet (20 mg total) by mouth every 12 (twelve) hours as needed (for abdominal pain/cramping). 02/20/18   Antony Madura, PA-C  ferrous sulfate 325 (65 FE) MG tablet Take 325 mg by mouth daily after breakfast.    [provider]  ibuprofen (ADVIL,MOTRIN) 600 MG tablet Take 1 tablet (600 mg total) by mouth every 6 (six) hours as needed for mild pain or moderate pain. 02/20/18   Antony Madura, PA-C  methocarbamol (ROBAXIN) 500 MG tablet Take 1 tablet (500 mg total) by mouth 2 (two) times daily. Patient not taking: Reported on 11/26/2017 11/13/17   Janne Napoleon, NP  naproxen (NAPROSYN) 500 MG tablet Take 1 tablet (500 mg total) by mouth 2 (two) times daily. Patient taking differently: Take 500 mg by mouth 2  (two) times daily as needed (pain).  11/13/17   Janne Napoleon, NP  predniSONE (DELTASONE) 50 MG tablet Take 1 tablet (50 mg total) by mouth daily. 03/25/18   Lawyer, Cristal Deer, PA-C  ranitidine (ZANTAC) 150 MG tablet Take 1 tablet (150 mg total) by mouth 2 (two) times daily. Patient not taking: Reported on 11/26/2017 10/30/17   Gilda Crease, MD  traMADol (ULTRAM) 50 MG tablet Take 1 tablet (50 mg total) by mouth every 6 (six) hours as needed for severe pain. 03/25/18   Charlestine Night, PA-C    Family History No family history on file.  Social History Social History   Tobacco Use  . Smoking status: Never Smoker  . Smokeless tobacco: Never Used  Substance Use Topics  . Alcohol use: Not Currently  . Drug use: Never     Allergies   Penicillins   Review of Systems Review of Systems Ten systems reviewed and are negative for acute change, except as noted in the HPI.    Physical Exam Updated Vital Signs BP (!) 146/102 (BP Location: Left Arm)   Pulse (!) 101   Temp 98.2 F (36.8 C) (Oral)   Resp 18   SpO2 99%   Physical Exam Vitals signs and nursing note reviewed.  Constitutional:      General: She is not in acute  distress.    Appearance: She is well-developed. She is not diaphoretic.  HENT:     Head: Normocephalic and atraumatic.     Mouth/Throat:     Pharynx: Oropharyngeal exudate and posterior oropharyngeal erythema present.  Eyes:     General: No scleral icterus.    Conjunctiva/sclera: Conjunctivae normal.  Neck:     Musculoskeletal: Normal range of motion.  Cardiovascular:     Rate and Rhythm: Normal rate and regular rhythm.     Heart sounds: Normal heart sounds. No murmur. No friction rub. No gallop.   Pulmonary:     Effort: Pulmonary effort is normal. No respiratory distress.     Breath sounds: Normal breath sounds.  Abdominal:     General: Bowel sounds are normal. There is no distension.     Palpations: Abdomen is soft. There is no mass.      Tenderness: There is no abdominal tenderness. There is no guarding.  Skin:    General: Skin is warm and dry.  Neurological:     Mental Status: She is alert and oriented to person, place, and time.  Psychiatric:        Behavior: Behavior normal.      ED Treatments / Results  Labs (all labs ordered are listed, but only abnormal results are displayed) Labs Reviewed - No data to display  EKG None  Radiology No results found.  Procedures Procedures (including critical care time)  Medications Ordered in ED Medications - No data to display   Initial Impression / Assessment and Plan / ED Course  I have reviewed the triage vital signs and the nursing notes.  Pertinent labs & imaging results that were available during my care of the patient were reviewed by me and considered in my medical decision making (see chart for details).        Patient with isolated sore throat.  Given the current coronavirus outbreak will forego strep swab testing and treat empirically for strep throat with Omnicef as patient is penicillin allergic.  Patient is advised to return for any new or worsening symptoms, follow-up with her primary care physician.  Discussed her hypertension and need for outpatient follow-up.  She appears appropriate for discharge at this time  Final Clinical Impressions(s) / ED Diagnoses   Final diagnoses:  None    ED Discharge Orders    None       Arthor Captain, PA-C 05/17/18 1935    Arthor Captain, PA-C 05/17/18 Robb Matar, MD 05/17/18 [phone]

## 2018-05-17 NOTE — ED Triage Notes (Signed)
C/o sore throat x 2 days.  Denies fever. 

## 2018-09-09 ENCOUNTER — Other Ambulatory Visit: Payer: Self-pay

## 2018-09-09 ENCOUNTER — Emergency Department (HOSPITAL_COMMUNITY): Payer: Medicaid Other

## 2018-09-09 ENCOUNTER — Emergency Department (HOSPITAL_COMMUNITY)
Admission: EM | Admit: 2018-09-09 | Discharge: 2018-09-09 | Disposition: A | Discharge status: Home / Self Care | Payer: Medicaid Other | Attending: Emergency Medicine | Admitting: Emergency Medicine

## 2018-09-09 ENCOUNTER — Encounter (HOSPITAL_COMMUNITY): Payer: Self-pay

## 2018-09-09 DIAGNOSIS — R1031 Right lower quadrant pain: Secondary | ICD-10-CM | POA: Insufficient documentation

## 2018-09-09 DIAGNOSIS — R1030 Lower abdominal pain, unspecified: Secondary | ICD-10-CM

## 2018-09-09 DIAGNOSIS — Z79899 Other long term (current) drug therapy: Secondary | ICD-10-CM | POA: Insufficient documentation

## 2018-09-09 DIAGNOSIS — R102 Pelvic and perineal pain: Secondary | ICD-10-CM | POA: Diagnosis not present

## 2018-09-09 DIAGNOSIS — N760 Acute vaginitis: Secondary | ICD-10-CM | POA: Insufficient documentation

## 2018-09-09 DIAGNOSIS — B9689 Other specified bacterial agents as the cause of diseases classified elsewhere: Secondary | ICD-10-CM | POA: Diagnosis not present

## 2018-09-09 DIAGNOSIS — N939 Abnormal uterine and vaginal bleeding, unspecified: Secondary | ICD-10-CM | POA: Insufficient documentation

## 2018-09-09 LAB — URINALYSIS, ROUTINE W REFLEX MICROSCOPIC
Bilirubin Urine: NEGATIVE
Glucose, UA: NEGATIVE mg/dL
Ketones, ur: NEGATIVE mg/dL
Leukocytes,Ua: NEGATIVE
Nitrite: NEGATIVE
Protein, ur: NEGATIVE mg/dL
Specific Gravity, Urine: 1.024 (ref 1.005–1.030)
pH: 7 (ref 5.0–8.0)

## 2018-09-09 LAB — COMPREHENSIVE METABOLIC PANEL
ALT: 17 U/L (ref 0–44)
AST: 17 U/L (ref 15–41)
Albumin: 3.2 g/dL — ABNORMAL LOW (ref 3.5–5.0)
Alkaline Phosphatase: 67 U/L (ref 38–126)
Anion gap: 9 (ref 5–15)
BUN: 10 mg/dL (ref 6–20)
CO2: 27 mmol/L (ref 22–32)
Calcium: 9.4 mg/dL (ref 8.9–10.3)
Chloride: 103 mmol/L (ref 98–111)
Creatinine, Ser: 0.64 mg/dL (ref 0.44–1.00)
GFR calc Af Amer: >60 mL/min (ref >60)
GFR calc non Af Amer: >60 mL/min (ref >60)
Glucose, Bld: 99 mg/dL (ref 70–99)
Potassium: 3.7 mmol/L (ref 3.5–5.1)
Sodium: 139 mmol/L (ref 135–145)
Total Bilirubin: 0.3 mg/dL (ref 0.3–1.2)
Total Protein: 6.7 g/dL (ref 6.5–8.1)

## 2018-09-09 LAB — CBC WITH DIFFERENTIAL/PLATELET
Abs Immature Granulocytes: 0.04 10*3/uL (ref 0.00–0.07)
Basophils Absolute: 0 10*3/uL (ref 0.0–0.1)
Basophils Relative: 0 %
Eosinophils Absolute: 0.1 10*3/uL (ref 0.0–0.5)
Eosinophils Relative: 1 %
HCT: 39.5 % (ref 36.0–46.0)
Hemoglobin: 12.6 g/dL (ref 12.0–15.0)
Immature Granulocytes: 0 %
Lymphocytes Relative: 24 %
Lymphs Abs: 2.6 10*3/uL (ref 0.7–4.0)
MCH: 26.1 pg (ref 26.0–34.0)
MCHC: 31.9 g/dL (ref 30.0–36.0)
MCV: 82 fL (ref 80.0–100.0)
Monocytes Absolute: 0.9 10*3/uL (ref 0.1–1.0)
Monocytes Relative: 8 %
Neutro Abs: 7.3 10*3/uL (ref 1.7–7.7)
Neutrophils Relative %: 67 %
Platelets: 341 10*3/uL (ref 150–400)
RBC: 4.82 MIL/uL (ref 3.87–5.11)
RDW: 13.2 % (ref 11.5–15.5)
WBC: 10.9 10*3/uL — ABNORMAL HIGH (ref 4.0–10.5)
nRBC: 0 % (ref 0.0–0.2)

## 2018-09-09 LAB — I-STAT BETA HCG BLOOD, ED (MC, WL, AP ONLY): I-stat hCG, quantitative: <5 m[IU]/mL (ref <5)

## 2018-09-09 LAB — WET PREP, GENITAL
Sperm: NONE SEEN
Trich, Wet Prep: NONE SEEN
Yeast Wet Prep HPF POC: NONE SEEN

## 2018-09-09 LAB — LIPASE, BLOOD: Lipase: 27 U/L (ref 11–51)

## 2018-09-09 MED ORDER — MORPHINE SULFATE (PF) 4 MG/ML IV SOLN
4.0000 mg | Freq: Once | INTRAVENOUS | Status: AC
  Administered 2018-09-09: 4 mg via INTRAVENOUS
  Filled 2018-09-09: qty 1

## 2018-09-09 MED ORDER — SODIUM CHLORIDE 0.9 % IV BOLUS
1000.0000 mL | Freq: Once | INTRAVENOUS | Status: AC
  Administered 2018-09-09: 1000 mL via INTRAVENOUS

## 2018-09-09 MED ORDER — IOHEXOL 300 MG/ML  SOLN
100.0000 mL | Freq: Once | INTRAMUSCULAR | Status: AC | PRN
  Administered 2018-09-09: 100 mL via INTRAVENOUS

## 2018-09-09 MED ORDER — METRONIDAZOLE 500 MG PO TABS: 500.0000 mg | ORAL_TABLET | Freq: Two times a day (BID) | ORAL | 0 refills | Status: AC

## 2018-09-09 MED ORDER — IOHEXOL 350 MG/ML SOLN: 100.0000 mL | Freq: Once | INTRAVENOUS | Status: DC | PRN

## 2018-09-09 MED ORDER — METRONIDAZOLE 500 MG PO TABS
500.0000 mg | ORAL_TABLET | Freq: Once | ORAL | Status: AC
  Administered 2018-09-09: 500 mg via ORAL
  Filled 2018-09-09: qty 1

## 2018-09-09 NOTE — ED Notes (Signed)
Patient returned from US.

## 2018-09-09 NOTE — ED Triage Notes (Signed)
Pt states she started new birth control pill a month and a half ago and has recently started having bleeding. Pt also endorses mucous discharge and back, pelvic, and flank pain 7/10.

## 2018-09-09 NOTE — ED Notes (Signed)
Pelvic cart at bedside. 

## 2018-09-09 NOTE — ED Provider Notes (Signed)
[facility] EMERGENCY DEPARTMENT Provider Note   CSN: [id number] Arrival date & time: 09/09/18  1419    History   Chief Complaint Chief Complaint  Patient presents with   Vaginal Bleeding    HPI Julie Huber is a 23 y.o. female presents today for evaluation of acute onset, intermittent pelvic pain since yesterday.  She reports sharp stabbing pains that have been coming and going since yesterday with associated malodorous vaginal discharge and vaginal bleeding.  She denies urinary symptoms, nausea, vomiting, fevers, chest pain, shortness of breath.  She has had a few episodes of watery nonbloody diarrhea but she states this is not unusual for her.  She has tried over-the-counter anti-inflammatories and a heating pad without relief of her symptoms.  She did recently start a new birth control pill 1.5 months ago but should not be having any withdrawal bleeding as she is not taking the placebo pills at this time.  She was not on any OCPs prior to this.  She is currently sexually active with one female partner and always uses condoms for additional protection.    The history is provided by the patient.    Past Medical History:  Diagnosis Date   Back pain     There are no active problems to display for this patient.   Past Surgical History:  Procedure Laterality Date   TONSILLECTOMY       OB History   No obstetric history on file.      Home Medications    Prior to Admission medications   Medication Sig Start Date End Date Taking? Authorizing Provider  ferrous sulfate 325 (65 FE) MG tablet Take 325 mg by mouth daily after breakfast.   Yes [provider]  ibuprofen (ADVIL) 200 MG tablet Take 200 mg by mouth every 6 (six) hours as needed for moderate pain (pelvic pain).   Yes [provider]  naproxen (NAPROSYN) 500 MG tablet Take 1 tablet (500 mg total) by mouth 2 (two) times daily. Patient taking differently: Take 500 mg by mouth 2  (two) times daily as needed (pain).  11/13/17  Yes Neese, Hope M, NP  SPRINTEC 28 0.25-35 MG-MCG tablet Take 1 tablet by mouth daily. 07/28/18  Yes [provider]  dicyclomine (BENTYL) 20 MG tablet Take 1 tablet (20 mg total) by mouth every 12 (twelve) hours as needed (for abdominal pain/cramping). Patient not taking: Reported on 09/09/2018 02/20/18   Antony Madura, PA-C  ibuprofen (ADVIL,MOTRIN) 600 MG tablet Take 1 tablet (600 mg total) by mouth every 6 (six) hours as needed for mild pain or moderate pain. Patient not taking: Reported on 09/09/2018 02/20/18   Antony Madura, PA-C  methocarbamol (ROBAXIN) 500 MG tablet Take 1 tablet (500 mg total) by mouth 2 (two) times daily. Patient not taking: Reported on 11/26/2017 11/13/17   Janne Napoleon, NP  metroNIDAZOLE (FLAGYL) 500 MG tablet Take 1 tablet (500 mg total) by mouth 2 (two) times daily for 7 days. 09/09/18 09/16/18  Michela Pitcher A, PA-C  predniSONE (DELTASONE) 50 MG tablet Take 1 tablet (50 mg total) by mouth daily. Patient not taking: Reported on 09/09/2018 03/25/18   Charlestine Night, PA-C  ranitidine (ZANTAC) 150 MG tablet Take 1 tablet (150 mg total) by mouth 2 (two) times daily. Patient not taking: Reported on 11/26/2017 10/30/17   Gilda Crease, MD  traMADol (ULTRAM) 50 MG tablet Take 1 tablet (50 mg total) by mouth every 6 (six) hours as needed for  severe pain. Patient not taking: Reported on 09/09/2018 03/25/18   Charlestine NightLawyer, Christopher, PA-C    Family History History reviewed. No pertinent family history.  Social History Social History   Tobacco Use   Smoking status: Never Smoker   Smokeless tobacco: Never Used  Substance Use Topics   Alcohol use: Not Currently   Drug use: Never     Allergies   Penicillins   Review of Systems Review of Systems  Constitutional: Negative for chills and fever.  Respiratory: Negative for shortness of breath.   Gastrointestinal: Positive for diarrhea. Negative for abdominal pain,  constipation, nausea and vomiting.  Genitourinary: Positive for pelvic pain, vaginal bleeding and vaginal discharge. Negative for dysuria, frequency and hematuria.  All other systems reviewed and are negative.    Physical Exam Updated Vital Signs BP 117/84 (BP Location: Left Arm)    Pulse (!) 102    Temp 98.2 F (36.8 C) (Oral)    Resp 18    SpO2 98%   Physical Exam Vitals signs and nursing note reviewed. Exam conducted with a chaperone present.  Constitutional:      General: She is not in acute distress.    Appearance: She is well-developed. She is obese.  HENT:     Head: Normocephalic and atraumatic.  Eyes:     General:        Right eye: No discharge.        Left eye: No discharge.     Conjunctiva/sclera: Conjunctivae normal.  Neck:     Vascular: No JVD.     Trachea: No tracheal deviation.  Cardiovascular:     Rate and Rhythm: Regular rhythm. Tachycardia present.     Comments: Mildly tachycardic Pulmonary:     Effort: Pulmonary effort is normal.     Breath sounds: Normal breath sounds.  Abdominal:     General: Bowel sounds are normal. There is no distension.     Palpations: Abdomen is soft.     Tenderness: There is abdominal tenderness in the right upper quadrant, right lower quadrant, periumbilical area and suprapubic area. There is no right CVA tenderness, left CVA tenderness, guarding or rebound. Positive signs include Rovsing's sign. Negative signs include Murphy's sign, McBurney's sign, psoas sign and obturator sign.  Genitourinary:    Cervix: No cervical motion tenderness.     Adnexa:        Right: Tenderness present. No mass.       Comments: Examination limited due to body habitus.  Patient has moderate vaginal bleeding on exam.  No cervical motion tenderness but has right adnexal tenderness.  No masses or lesions to the external genitalia. Skin:    Findings: No erythema.  Neurological:     Mental Status: She is alert.  Psychiatric:        Behavior: Behavior  normal.      ED Treatments / Results  Labs (all labs ordered are listed, but only abnormal results are displayed) Labs Reviewed  WET PREP, GENITAL - Abnormal; Notable for the following components:      Result Value   Clue Cells Wet Prep HPF POC PRESENT (*)    WBC, Wet Prep HPF POC FEW (*)    All other components within normal limits  URINALYSIS, ROUTINE W REFLEX MICROSCOPIC - Abnormal; Notable for the following components:   APPearance HAZY (*)    Hgb urine dipstick LARGE (*)    Bacteria, UA RARE (*)    All other components within normal limits  COMPREHENSIVE METABOLIC  PANEL - Abnormal; Notable for the following components:   Albumin 3.2 (*)    All other components within normal limits  CBC WITH DIFFERENTIAL/PLATELET - Abnormal; Notable for the following components:   WBC 10.9 (*)    All other components within normal limits  LIPASE, BLOOD  RPR  HIV ANTIBODY (ROUTINE TESTING W REFLEX)  I-STAT BETA HCG BLOOD, ED ([facility], [facility], [facility] ONLY)  GC/CHLAMYDIA PROBE AMP ([place]) NOT AT [facility]    EKG None  Radiology US Transvaginal Non-ob  Result Date: 09/09/2018 CLINICAL DATA:  Right pelvic pain for 2 days. EXAM: TRANSABDOMINAL AND TRANSVAGINAL ULTRASOUND OF PELVIS DOPPLER ULTRASOUND OF OVARIES TECHNIQUE: Both transabdominal and transvaginal ultrasound examinations of the pelvis were performed. Transabdominal technique was performed for global imaging of the pelvis including uterus, ovaries, adnexal regions, and pelvic cul-de-sac. It was necessary to proceed with endovaginal exam following the transabdominal exam to visualize the endometrium and right adnexa. Color and duplex Doppler ultrasound was utilized to evaluate blood flow to the ovaries. COMPARISON:  None. FINDINGS: Uterus Measurements: 7.4 x 3.3 x 3.4 cm = volume: 44 mL. No fibroids or other mass visualized. Endometrium Thickness: 6.4 mm.  No focal abnormality visualized. Right ovary Measurements: 2.8 x 1.6 x 2.0 cm = volume: 4.7 mL.  Normal appearance/no adnexal mass. Left ovary Measurements: 2.5 x 2.0 x 1.9 cm = volume: 4.9 mL. Normal appearance/no adnexal mass. Pulsed Doppler evaluation of both ovaries demonstrates normal low-resistance arterial and venous waveforms. Other findings No abnormal free fluid. IMPRESSION: Normal pelvic ultrasound. Electronically Signed   By: Fidela Salisbury M.D.   On: 09/09/2018 17:22   US Pelvis Complete  Result Date: 09/09/2018 CLINICAL DATA:  Right pelvic pain for 2 days. EXAM: TRANSABDOMINAL AND TRANSVAGINAL ULTRASOUND OF PELVIS DOPPLER ULTRASOUND OF OVARIES TECHNIQUE: Both transabdominal and transvaginal ultrasound examinations of the pelvis were performed. Transabdominal technique was performed for global imaging of the pelvis including uterus, ovaries, adnexal regions, and pelvic cul-de-sac. It was necessary to proceed with endovaginal exam following the transabdominal exam to visualize the endometrium and right adnexa. Color and duplex Doppler ultrasound was utilized to evaluate blood flow to the ovaries. COMPARISON:  None. FINDINGS: Uterus Measurements: 7.4 x 3.3 x 3.4 cm = volume: 44 mL. No fibroids or other mass visualized. Endometrium Thickness: 6.4 mm.  No focal abnormality visualized. Right ovary Measurements: 2.8 x 1.6 x 2.0 cm = volume: 4.7 mL. Normal appearance/no adnexal mass. Left ovary Measurements: 2.5 x 2.0 x 1.9 cm = volume: 4.9 mL. Normal appearance/no adnexal mass. Pulsed Doppler evaluation of both ovaries demonstrates normal low-resistance arterial and venous waveforms. Other findings No abnormal free fluid. IMPRESSION: Normal pelvic ultrasound. Electronically Signed   By: Fidela Salisbury M.D.   On: 09/09/2018 17:22   Ct Abdomen Pelvis W Contrast  Result Date: 09/09/2018 CLINICAL DATA:  Right lower quadrant pain EXAM: CT ABDOMEN AND PELVIS WITH CONTRAST TECHNIQUE: Multidetector CT imaging of the abdomen and pelvis was performed using the standard protocol following bolus  administration of intravenous contrast. CONTRAST:  165mL OMNIPAQUE IOHEXOL 300 MG/ML  SOLN COMPARISON:  04/05/2018 FINDINGS: Lower chest: No acute abnormality. Hepatobiliary: No focal liver abnormality is seen. No gallstones, gallbladder wall thickening, or biliary dilatation. Pancreas: Unremarkable. No pancreatic ductal dilatation or surrounding inflammatory changes. Spleen: Normal in size without focal abnormality. Adrenals/Urinary Tract: Adrenal glands are within normal limits. Kidneys are well visualized bilaterally. No renal calculi or urinary tract obstructive changes are seen. The bladder is decompressed. Stomach/Bowel: The appendix is  within normal limits. No obstructive or inflammatory changes of the large or small bowel are seen. The stomach is within normal limits. Vascular/Lymphatic: No significant vascular findings are present. No enlarged abdominal or pelvic lymph nodes. Reproductive: Uterus and bilateral adnexa are unremarkable. Other: No abdominal wall hernia or abnormality. No abdominopelvic ascites. Musculoskeletal: No acute bony abnormality noted. IMPRESSION: Normal-appearing appendix. No acute abnormality noted. Electronically Signed   By: Alcide CleverMark  Lukens M.D.   On: 09/09/2018 18:12   [place] Art/ven Flow Abd Pelv Doppler  Result Date: 09/09/2018 CLINICAL DATA:  Right pelvic pain for 2 days. EXAM: TRANSABDOMINAL AND TRANSVAGINAL ULTRASOUND OF PELVIS DOPPLER ULTRASOUND OF OVARIES TECHNIQUE: Both transabdominal and transvaginal ultrasound examinations of the pelvis were performed. Transabdominal technique was performed for global imaging of the pelvis including uterus, ovaries, adnexal regions, and pelvic cul-de-sac. It was necessary to proceed with endovaginal exam following the transabdominal exam to visualize the endometrium and right adnexa. Color and duplex Doppler ultrasound was utilized to evaluate blood flow to the ovaries. COMPARISON:  None. FINDINGS: Uterus Measurements: 7.4 x 3.3 x 3.4 cm =  volume: 44 mL. No fibroids or other mass visualized. Endometrium Thickness: 6.4 mm.  No focal abnormality visualized. Right ovary Measurements: 2.8 x 1.6 x 2.0 cm = volume: 4.7 mL. Normal appearance/no adnexal mass. Left ovary Measurements: 2.5 x 2.0 x 1.9 cm = volume: 4.9 mL. Normal appearance/no adnexal mass. Pulsed Doppler evaluation of both ovaries demonstrates normal low-resistance arterial and venous waveforms. Other findings No abnormal free fluid. IMPRESSION: Normal pelvic ultrasound. Electronically Signed   By: Ted Mcalpineobrinka  Dimitrova M.D.   On: 09/09/2018 17:22    Procedures Procedures (including critical care time)  Medications Ordered in ED Medications  iohexol (OMNIPAQUE) 350 MG/ML injection 100 mL (has no administration in time range)  sodium chloride 0.9 % bolus 1,000 mL (0 mLs Intravenous Stopped 09/09/18 1812)  morphine 4 MG/ML injection 4 mg (4 mg Intravenous Given 09/09/18 1646)  iohexol (OMNIPAQUE) 300 MG/ML solution 100 mL (100 mLs Intravenous Contrast Given 09/09/18 1754)  metroNIDAZOLE (FLAGYL) tablet 500 mg (500 mg Oral Given 09/09/18 1921)     Initial Impression / Assessment and Plan / ED Course  I have reviewed the triage vital signs and the nursing notes.  Pertinent labs & imaging results that were available during my care of the patient were reviewed by me and considered in my medical decision making (see chart for details).        Patient presents for evaluation of lower abdominal pain, abnormal vaginal bleeding.  She is afebrile, initially mildly tachycardic with some improvement on reevaluation and on chart review it appears that she runs mildly tachycardic at baseline.  Vital signs otherwise stable.  She is nontoxic in appearance.  Examination somewhat limited due to body habitus but she exhibits no peritoneal signs.  Lab work shows mild nonspecific leukocytosis, no anemia, no metabolic derangements, no renal insufficiency.  Her UA does not suggest UTI but wet prep  does suggest BV with evidence of clue cells and WBCs.  Imaging today shows no evidence of acute surgical abdominal pathology and pelvic ultrasound shows no acute findings.  No evidence of TOA, ovarian torsion, ectopic pregnancy, or PID.  No evidence of obstruction, perforation, appendicitis, cholecystitis, or dissection.  On reevaluation patient resting comfortably no apparent distress.  Serial abdominal examinations remain benign and she is tolerating p.o. fluid in the ED.  Will discharge with course of Flagyl for her BV and recommend follow-up with her OB/GYN for  evaluation of abnormal breakthrough bleeding while taking OCPs.  Discussed strict ED return precautions.  Patient verbalized understanding of and agreement with plan and patient stable for discharge home at this time.  Final Clinical Impressions(s) / ED Diagnoses   Final diagnoses:  BV (bacterial vaginosis)  Abnormal uterine bleeding (AUB)  Lower abdominal pain    ED Discharge Orders         Ordered    metroNIDAZOLE (FLAGYL) 500 MG tablet  2 times daily     09/09/18 1923           Bennye AlmFawze, Rahil Passey A, PA-C 09/09/18 1930    Charlynne PanderYao, David Hsienta, MD 09/09/18 [phone]

## 2018-09-09 NOTE — Discharge Instructions (Signed)
Please take all of your antibiotics until finished!   You may develop abdominal discomfort or diarrhea from the antibiotic.  You may help offset this with probiotics which you can buy or get in yogurt. Do not eat  or take the probiotics until 2 hours after your antibiotic.   Call your OB/GYN for further recommendations, they may want you to be seen in the office for reevaluation.  In the meantime, continue taking your birth control pills as prescribed at the same time every day.  Return to the emergency department immediately for any concerning signs or symptoms develop such as severe pains, persistent vomiting, or fevers.

## 2018-09-09 NOTE — ED Notes (Signed)
Patient in gown. Patient sent to bathroom, to give urine sample.

## 2018-09-12 LAB — GC/CHLAMYDIA PROBE AMP (~~LOC~~) NOT AT ARMC
Chlamydia: NEGATIVE
Neisseria Gonorrhea: NEGATIVE

## 2018-10-08 ENCOUNTER — Emergency Department (HOSPITAL_COMMUNITY)
Admission: EM | Admit: 2018-10-08 | Discharge: 2018-10-09 | Disposition: A | Discharge status: Home / Self Care | Payer: Medicaid Other | Attending: Emergency Medicine | Admitting: Emergency Medicine

## 2018-10-08 ENCOUNTER — Other Ambulatory Visit: Payer: Self-pay

## 2018-10-08 DIAGNOSIS — K5900 Constipation, unspecified: Secondary | ICD-10-CM | POA: Insufficient documentation

## 2018-10-08 DIAGNOSIS — R350 Frequency of micturition: Secondary | ICD-10-CM | POA: Diagnosis not present

## 2018-10-08 DIAGNOSIS — R10815 Periumbilic abdominal tenderness: Secondary | ICD-10-CM | POA: Diagnosis not present

## 2018-10-08 DIAGNOSIS — R112 Nausea with vomiting, unspecified: Secondary | ICD-10-CM | POA: Insufficient documentation

## 2018-10-08 DIAGNOSIS — R1010 Upper abdominal pain, unspecified: Secondary | ICD-10-CM

## 2018-10-08 LAB — URINALYSIS, ROUTINE W REFLEX MICROSCOPIC
Bilirubin Urine: NEGATIVE
Glucose, UA: NEGATIVE mg/dL
Hgb urine dipstick: NEGATIVE
Ketones, ur: NEGATIVE mg/dL
Nitrite: NEGATIVE
Protein, ur: NEGATIVE mg/dL
Specific Gravity, Urine: 1.028 (ref 1.005–1.030)
pH: 5 (ref 5.0–8.0)

## 2018-10-08 LAB — CBC
HCT: 41.9 % (ref 36.0–46.0)
Hemoglobin: 13.2 g/dL (ref 12.0–15.0)
MCH: 25.9 pg — ABNORMAL LOW (ref 26.0–34.0)
MCHC: 31.5 g/dL (ref 30.0–36.0)
MCV: 82.2 fL (ref 80.0–100.0)
Platelets: 367 10*3/uL (ref 150–400)
RBC: 5.1 MIL/uL (ref 3.87–5.11)
RDW: 13.2 % (ref 11.5–15.5)
WBC: 11 10*3/uL — ABNORMAL HIGH (ref 4.0–10.5)
nRBC: 0 % (ref 0.0–0.2)

## 2018-10-08 LAB — COMPREHENSIVE METABOLIC PANEL
ALT: 19 U/L (ref 0–44)
AST: 18 U/L (ref 15–41)
Albumin: 3.5 g/dL (ref 3.5–5.0)
Alkaline Phosphatase: 67 U/L (ref 38–126)
Anion gap: 12 (ref 5–15)
BUN: 9 mg/dL (ref 6–20)
CO2: 26 mmol/L (ref 22–32)
Calcium: 9.3 mg/dL (ref 8.9–10.3)
Chloride: 99 mmol/L (ref 98–111)
Creatinine, Ser: 0.65 mg/dL (ref 0.44–1.00)
GFR calc Af Amer: >60 mL/min (ref >60)
GFR calc non Af Amer: >60 mL/min (ref >60)
Glucose, Bld: 134 mg/dL — ABNORMAL HIGH (ref 70–99)
Potassium: 4.7 mmol/L (ref 3.5–5.1)
Sodium: 137 mmol/L (ref 135–145)
Total Bilirubin: 0.3 mg/dL (ref 0.3–1.2)
Total Protein: 7.1 g/dL (ref 6.5–8.1)

## 2018-10-08 LAB — LIPASE, BLOOD: Lipase: 24 U/L (ref 11–51)

## 2018-10-08 LAB — I-STAT BETA HCG BLOOD, ED (MC, WL, AP ONLY): I-stat hCG, quantitative: <5 m[IU]/mL (ref <5)

## 2018-10-08 NOTE — ED Triage Notes (Signed)
Pt c/o abdominal pain staeted today and constipation started two weeks ago. States frequent urination also with loss of appetite.

## 2018-10-09 MED ORDER — POLYETHYLENE GLYCOL 3350 17 G PO PACK: 17.0000 g | PACK | Freq: Three times a day (TID) | ORAL | 0 refills | Status: DC

## 2018-10-09 NOTE — Discharge Instructions (Addendum)
Use Miralax as prescribed (it is available over-the-counter). Drink lots of water. Follow suggested high fiber diet for additional relief of constipation. Follow up with your doctor for recheck in one week.

## 2018-10-09 NOTE — ED Provider Notes (Signed)
[facility] EMERGENCY DEPARTMENT Provider Note   CSN: [id number] Arrival date & time: 10/08/18  2137     History   Chief Complaint Chief Complaint  Patient presents with  . Abdominal Pain    HPI Julie Huber is a 23 y.o. female.     Patient to ED with complaint of abdominal pain that started yesterday afternoon after eating a hamburger/fries. The pain is noted to be across the middle abdomen and associated with nausea, limited vomiting episodes. She has had very small quantity, hard stools for the past 2 weeks. No fever, chest pain, SOB, cough. She denies similar symptoms previously. She has not taken anything for constipation. She is also complaining that she has urinary frequency without dysuria or urgency.  The history is provided by the patient. No language interpreter was used.  Abdominal Pain Associated symptoms: constipation, nausea and vomiting   Associated symptoms: no chills, no cough, no fever and no shortness of breath     Past Medical History:  Diagnosis Date  . Back pain     There are no active problems to display for this patient.   Past Surgical History:  Procedure Laterality Date  . TONSILLECTOMY       OB History   No obstetric history on file.      Home Medications    Prior to Admission medications   Medication Sig Start Date End Date Taking? Authorizing Provider  [place] 28 0.25-35 MG-MCG tablet Take 1 tablet by mouth daily. 07/28/18  Yes [provider]  dicyclomine (BENTYL) 20 MG tablet Take 1 tablet (20 mg total) by mouth every 12 (twelve) hours as needed (for abdominal pain/cramping). Patient not taking: Reported on 09/09/2018 02/20/18   Antonietta Breach, PA-C  ibuprofen (ADVIL,MOTRIN) 600 MG tablet Take 1 tablet (600 mg total) by mouth every 6 (six) hours as needed for mild pain or moderate pain. Patient not taking: Reported on 09/09/2018 02/20/18   Antonietta Breach, PA-C  methocarbamol (ROBAXIN) 500 MG tablet Take  1 tablet (500 mg total) by mouth 2 (two) times daily. Patient not taking: Reported on 11/26/2017 11/13/17   Ashley Murrain, NP  naproxen (NAPROSYN) 500 MG tablet Take 1 tablet (500 mg total) by mouth 2 (two) times daily. Patient not taking: Reported on 10/09/2018 11/13/17   Ashley Murrain, NP  predniSONE (DELTASONE) 50 MG tablet Take 1 tablet (50 mg total) by mouth daily. Patient not taking: Reported on 09/09/2018 03/25/18   Dalia Heading, PA-C  ranitidine (ZANTAC) 150 MG tablet Take 1 tablet (150 mg total) by mouth 2 (two) times daily. Patient not taking: Reported on 11/26/2017 10/30/17   Orpah Greek, MD  traMADol (ULTRAM) 50 MG tablet Take 1 tablet (50 mg total) by mouth every 6 (six) hours as needed for severe pain. Patient not taking: Reported on 09/09/2018 03/25/18   Dalia Heading, PA-C    Family History No family history on file.  Social History Social History   Tobacco Use  . Smoking status: Never Smoker  . Smokeless tobacco: Never Used  Substance Use Topics  . Alcohol use: Not Currently  . Drug use: Never     Allergies   Penicillins   Review of Systems Review of Systems  Constitutional: Negative for chills and fever.  HENT: Negative.   Respiratory: Negative.  Negative for cough and shortness of breath.   Cardiovascular: Negative.   Gastrointestinal: Positive for abdominal pain, constipation, nausea and vomiting.  Genitourinary: Positive for frequency.  Musculoskeletal: Negative.   Skin: Negative.   Neurological: Negative.      Physical Exam Updated Vital Signs BP 134/89 (BP Location: Right Wrist)   Pulse 89   Temp 98 F (36.7 C) (Oral)   Resp 20   SpO2 98%   Physical Exam Vitals signs and nursing note reviewed.  Constitutional:      Appearance: She is well-developed.  HENT:     Head: Normocephalic.  Neck:     Musculoskeletal: Normal range of motion and neck supple.  Cardiovascular:     Rate and Rhythm: Normal rate and regular rhythm.   Pulmonary:     Effort: Pulmonary effort is normal.     Breath sounds: Normal breath sounds.  Abdominal:     General: Bowel sounds are normal.     Palpations: Abdomen is soft.     Tenderness: There is abdominal tenderness in the right upper quadrant, periumbilical area and left upper quadrant. There is no guarding or rebound.  Musculoskeletal: Normal range of motion.  Skin:    General: Skin is warm and dry.     Findings: No rash.  Neurological:     Mental Status: She is alert.     Cranial Nerves: No cranial nerve deficit.      ED Treatments / Results  Labs (all labs ordered are listed, but only abnormal results are displayed) Labs Reviewed  COMPREHENSIVE METABOLIC PANEL - Abnormal; Notable for the following components:      Result Value   Glucose, Bld 134 (*)    All other components within normal limits  CBC - Abnormal; Notable for the following components:   WBC 11.0 (*)    MCH 25.9 (*)    All other components within normal limits  URINALYSIS, ROUTINE W REFLEX MICROSCOPIC - Abnormal; Notable for the following components:   APPearance HAZY (*)    Leukocytes,Ua SMALL (*)    Bacteria, UA FEW (*)    All other components within normal limits  LIPASE, BLOOD  I-STAT BETA HCG BLOOD, ED ([facility], [facility], [facility] ONLY)    EKG None  Radiology No results found.  Procedures Procedures (including critical care time)  Medications Ordered in ED Medications - No data to display   Initial Impression / Assessment and Plan / ED Course  I have reviewed the triage vital signs and the nursing notes.  Pertinent labs & imaging results that were available during my care of the patient were reviewed by me and considered in my medical decision making (see chart for details).        Patient to ED with complaint of abdominal pain, N, V that started yesterday afternoon. She reports constipation x 2 weeks and also urinary frequency.   She is very well appearing. VSS, afebrile. Labs are essentially  unremarkable. No leukocytosis, elevated LFT's, or lipase. Doubt acute infection, acute cholecystitis, pancreatitis. Feel symptoms are likely related to constipation. Discussed with Dr. Eudelia Bunchardama who has seen the patient and agrees she is appropriate for discharge home. Will Rx Miralax TID dosing, PCP follow up. Return precautions discussed.   Final Clinical Impressions(s) / ED Diagnoses   Final diagnoses:  None   1. Abdominal pain 2. Constipation  ED Discharge Orders    None       Elpidio AnisUpstill, Koty Anctil, PA-C 10/09/18 [id number]    Nira Connardama, Pedro Eduardo, MD 10/09/18 [phone]

## 2018-10-15 ENCOUNTER — Encounter (HOSPITAL_COMMUNITY): Payer: Self-pay | Admitting: *Deleted

## 2018-10-15 ENCOUNTER — Other Ambulatory Visit: Payer: Self-pay

## 2018-10-15 ENCOUNTER — Emergency Department (HOSPITAL_COMMUNITY)
Admission: EM | Admit: 2018-10-15 | Discharge: 2018-10-15 | Disposition: A | Discharge status: Home / Self Care | Payer: Medicaid Other | Attending: Emergency Medicine | Admitting: Emergency Medicine

## 2018-10-15 ENCOUNTER — Emergency Department (HOSPITAL_COMMUNITY): Payer: Medicaid Other

## 2018-10-15 DIAGNOSIS — Z79899 Other long term (current) drug therapy: Secondary | ICD-10-CM | POA: Insufficient documentation

## 2018-10-15 DIAGNOSIS — W108XXA Fall (on) (from) other stairs and steps, initial encounter: Secondary | ICD-10-CM | POA: Diagnosis not present

## 2018-10-15 DIAGNOSIS — M545 Low back pain, unspecified: Secondary | ICD-10-CM

## 2018-10-15 DIAGNOSIS — W19XXXA Unspecified fall, initial encounter: Secondary | ICD-10-CM

## 2018-10-15 MED ORDER — METHOCARBAMOL 500 MG PO TABS: 500.0000 mg | ORAL_TABLET | Freq: Two times a day (BID) | ORAL | 0 refills | Status: DC

## 2018-10-15 MED ORDER — HYDROCODONE-ACETAMINOPHEN 5-325 MG PO TABS
2.0000 | ORAL_TABLET | Freq: Once | ORAL | Status: AC
  Administered 2018-10-15: 2 via ORAL
  Filled 2018-10-15: qty 2

## 2018-10-15 NOTE — Discharge Instructions (Signed)
You can take Tylenol or Ibuprofen as directed for pain. You can alternate Tylenol and Ibuprofen every 4 hours. If you take Tylenol at 1pm, then you can take Ibuprofen at 5pm. Then you can take Tylenol again at 9pm.   Take Robaxin as prescribed. This medication will make you drowsy so do not drive or drink alcohol when taking it.  Apply heat to help with pain.  Return to the Emergency Department immediately for any worsening back pain, neck pain, difficulty walking, numbness/weaknss of your arms or legs, urinary or bowel accidents, fever or any other worsening or concerning symptoms.

## 2018-10-15 NOTE — ED Notes (Signed)
Patient transported to X-ray 

## 2018-10-15 NOTE — ED Triage Notes (Signed)
The pt fellyesterday while it was raining  Now has lumbar spine pain and she has pain in her lt forearm  No loc

## 2018-10-15 NOTE — ED Provider Notes (Signed)
[facility]ERGENCY [facility] Provider Note   CSN: [id number] Arrival date & time: 10/15/18  1518    History   Chief Complaint Chief Complaint  Patient presents with  . Fall    HPI Julie Huber is a 23 y.o. female who presents for evaluation of lower back pain, left upper extremity pain after mechanical fall that occurred last night.  Patient reports that she fell down 2 stairs while it was raining.  She reports that she landed on her lower back.  No head injury, LOC.  She has been able to ambulate since the incident.  She reports worsening pain in the lower back and left upper forearm since the incident.  She took 1 dose of ibuprofen today which had no improvement in pain, prompting ED visit.  Patient states that her pain is worse with movement and with bending.  She has been able to ambulate but does report worsening pain with doing so.  She has no prior history of back surgeries.  Patient denies any numbness/weakness of arms or legs, urinary or or bowel incontinence, saddle anesthesia.     The history is provided by the patient.    Past Medical History:  Diagnosis Date  . Back pain     There are no active problems to display for this patient.   Past Surgical History:  Procedure Laterality Date  . TONSILLECTOMY       OB History   No obstetric history on file.      Home Medications    Prior to Admission medications   Medication Sig Start Date End Date Taking? Authorizing Provider  dicyclomine (BENTYL) 20 MG tablet Take 1 tablet (20 mg total) by mouth every 12 (twelve) hours as needed (for abdominal pain/cramping). Patient not taking: Reported on 09/09/2018 02/20/18   Antony MaduraHumes, Kelly, PA-C  ibuprofen (ADVIL,MOTRIN) 600 MG tablet Take 1 tablet (600 mg total) by mouth every 6 (six) hours as needed for mild pain or moderate pain. Patient not taking: Reported on 09/09/2018 02/20/18   Antony MaduraHumes, Kelly, PA-C  methocarbamol (ROBAXIN) 500 MG tablet Take 1  tablet (500 mg total) by mouth 2 (two) times daily. 10/15/18   Maxwell CaulLayden, Lindsey A, PA-C  naproxen (NAPROSYN) 500 MG tablet Take 1 tablet (500 mg total) by mouth 2 (two) times daily. Patient not taking: Reported on 10/09/2018 11/13/17   Janne NapoleonNeese, Hope M, NP  polyethylene glycol (MIRALAX) 17 g packet Take 17 g by mouth 3 (three) times daily. Take Miralax up the 3 times daily until bowels move, for a maximum 3 consecutive days. 10/09/18   Elpidio AnisUpstill, Shari, PA-C  predniSONE (DELTASONE) 50 MG tablet Take 1 tablet (50 mg total) by mouth daily. Patient not taking: Reported on 09/09/2018 03/25/18   Charlestine NightLawyer, Christopher, PA-C  ranitidine (ZANTAC) 150 MG tablet Take 1 tablet (150 mg total) by mouth 2 (two) times daily. Patient not taking: Reported on 11/26/2017 10/30/17   Gilda CreasePollina, Christopher J, MD  SPRINTEC 28 0.25-35 MG-MCG tablet Take 1 tablet by mouth daily. 07/28/18   [provider]  traMADol (ULTRAM) 50 MG tablet Take 1 tablet (50 mg total) by mouth every 6 (six) hours as needed for severe pain. Patient not taking: Reported on 09/09/2018 03/25/18   Charlestine NightLawyer, Christopher, PA-C    Family History No family history on file.  Social History Social History   Tobacco Use  . Smoking status: Never Smoker  . Smokeless tobacco: Never Used  Substance Use Topics  . Alcohol use: Not  Currently  . Drug use: Never     Allergies   Penicillins   Review of Systems Review of Systems  Musculoskeletal: Positive for back pain.       Left upper extremity pain.  Neurological: Negative for weakness and numbness.  All other systems reviewed and are negative.    Physical Exam Updated Vital Signs BP 132/84 (BP Location: Right Arm)   Pulse 94   Temp 98 F (36.7 C) (Oral)   Resp 18   Ht 5\' 5"  (1.651 m)   Wt (!) 166 kg   LMP 09/19/2018   SpO2 95%   BMI 60.91 kg/m   Physical Exam Vitals signs and nursing note reviewed.  Constitutional:      Appearance: She is well-developed.  HENT:     Head: Normocephalic  and atraumatic.  Eyes:     General: No scleral icterus.       Right eye: No discharge.        Left eye: No discharge.     Conjunctiva/sclera: Conjunctivae normal.  Neck:     Comments: Full flexion/extension and lateral movement of neck fully intact. No bony midline tenderness. No deformities or crepitus.  Cardiovascular:     Pulses:          Radial pulses are 2+ on the right side and 2+ on the left side.  Pulmonary:     Effort: Pulmonary effort is normal.  Musculoskeletal:       Back:     Comments: No midline T-spine tenderness.  No deformity habitus noted.  Tenderness palpation of the left lower lumbar area that extends over to the midline.  No deformity or crepitus noted.  Skin:    General: Skin is warm and dry.     Comments: Good distal cap refill. LUE is not dusky in appearance or cool to touch.  Neurological:     Mental Status: She is alert.     Comments: Follows commands, Moves all extremities  5/5 strength to BUE and BLE  Sensation intact throughout all major nerve distributions  Psychiatric:        Speech: Speech normal.        Behavior: Behavior normal.      ED Treatments / Results  Labs (all labs ordered are listed, but only abnormal results are displayed) Labs Reviewed - No data to display  EKG None  Radiology Dg Lumbar Spine Complete  Result Date: 10/15/2018 CLINICAL DATA:  Back pain EXAM: LUMBAR SPINE - COMPLETE 4+ VIEW COMPARISON:  CT dated September 09, 2018 FINDINGS: There is potentially ambiguous vertebral body numbering. For the purpose of this report, there is lumbarization of the S1 vertebral body. There is no displaced fracture. No dislocation. The disc heights are relatively well preserved with mild disc height loss at the L5-S1 level. IMPRESSION: No acute osseous abnormality. Electronically Signed   By: Katherine Mantlehristopher  Green M.D.   On: 10/15/2018 19:33   Dg Forearm Left  Result Date: 10/15/2018 CLINICAL DATA:  Pain status post fall EXAM: LEFT FOREARM - 2  VIEW COMPARISON:  None. FINDINGS: There is no evidence of fracture or other focal bone lesions. Soft tissues are unremarkable. IMPRESSION: Negative. Electronically Signed   By: Katherine Mantlehristopher  Green M.D.   On: 10/15/2018 19:34    Procedures Procedures (including critical care time)  Medications Ordered in ED Medications  HYDROcodone-acetaminophen (NORCO/VICODIN) 5-325 MG per tablet 2 tablet (2 tablets Oral Given 10/15/18 1937)     Initial Impression / Assessment and Plan /  ED Course  I have reviewed the triage vital signs and the nursing notes.  Pertinent labs & imaging results that were available during my care of the patient were reviewed by me and considered in my medical decision making (see chart for details).        23 year old female who presents for evaluation of left lower back pain, left forearm pain after mechanical fall that occurred yesterday.  No head injury, LOC.  No numbness/weakness of arms or legs, saddle anesthesia, urinary or bowel continence. Patient is afebrile, non-toxic appearing, sitting comfortably on examination table. Vital signs reviewed and stable.  Suspect musculoskeletal injury.  Low suspicion fracture or dislocation given history of trauma, will plan for imaging.  History/physical exam not concerning for cauda equina.   Lumbar x-ray negative for any acute bony abnormality.  Forearm x-ray negative for any acute bony abnormality.  Discussed results with patient.  Patient has been ambulatory in ED without any difficulty.  Encouraged at home supportive care measures. At this time, patient exhibits no emergent life-threatening condition that require further evaluation in ED or admission. Patient had ample opportunity for questions and discussion. All patient's questions were answered with full understanding. Strict return precautions discussed. Patient expresses understanding and agreement to plan.   Portions of this note were generated with Theatre manager. Dictation errors may occur despite best attempts at proofreading.   Final Clinical Impressions(s) / ED Diagnoses   Final diagnoses:  Fall, initial encounter  Acute right-sided low back pain, unspecified whether sciatica present    ED Discharge Orders         Ordered    methocarbamol (ROBAXIN) 500 MG tablet  2 times daily     10/15/18 2010           Desma Mcgregor 10/16/18 0008    Elnora Morrison, MD 10/17/18 [phone]

## 2019-10-07 ENCOUNTER — Encounter (HOSPITAL_COMMUNITY): Payer: Self-pay | Admitting: Emergency Medicine

## 2019-10-07 ENCOUNTER — Other Ambulatory Visit: Payer: Self-pay

## 2019-10-07 ENCOUNTER — Emergency Department (HOSPITAL_COMMUNITY): Payer: Medicaid Other

## 2019-10-07 ENCOUNTER — Emergency Department (HOSPITAL_COMMUNITY)
Admission: EM | Admit: 2019-10-07 | Discharge: 2019-10-07 | Disposition: A | Discharge status: Home / Self Care | Payer: Medicaid Other | Attending: Emergency Medicine | Admitting: Emergency Medicine

## 2019-10-07 DIAGNOSIS — M79671 Pain in right foot: Secondary | ICD-10-CM

## 2019-10-07 DIAGNOSIS — M25571 Pain in right ankle and joints of right foot: Secondary | ICD-10-CM | POA: Insufficient documentation

## 2019-10-07 DIAGNOSIS — M2540 Effusion, unspecified joint: Secondary | ICD-10-CM | POA: Insufficient documentation

## 2019-10-07 HISTORY — DX: Anxiety disorder, unspecified: F41.9

## 2019-10-07 HISTORY — DX: Depression, unspecified: F32.A

## 2019-10-07 MED ORDER — OXYCODONE-ACETAMINOPHEN 5-325 MG PO TABS
1.0000 | ORAL_TABLET | Freq: Once | ORAL | Status: AC
  Administered 2019-10-07: 1 via ORAL
  Filled 2019-10-07: qty 1

## 2019-10-07 NOTE — ED Notes (Signed)
Pt transported to xray 

## 2019-10-07 NOTE — Progress Notes (Signed)
Orthopedic Tech Progress Note Patient Details:  Julie Huber 1995-04-13 [id number]  Ortho Devices Type of Ortho Device: Crutches, Postop shoe/boot Ortho Device/Splint Location: RLE Ortho Device/Splint Interventions: Ordered, Application, Adjustment   Post Interventions Patient Tolerated: Well Instructions Provided: Care of device, Adjustment of device, Poper ambulation with device   Alejandra Hunt 10/07/2019, 12:36 PM

## 2019-10-07 NOTE — Discharge Instructions (Signed)
You were seen in the ER for foot pain  X-ray is normal, no stress fracture  Cause of pain is unclear but likely from soft tissue (tendon, ligament) overuse.    Rest for 48-72 hours. Elevate the foot. For pain and inflammation you can use a combination of ibuprofen and acetaminophen.  Take [phone] mg acetaminophen (tylenol) every 6 hours or 600 mg ibuprofen (advil, motrin) every 6 hours.  You can take these separately or combine them every 6 hours for maximum pain control. Do not exceed 4,000 mg acetaminophen or 2,400 mg ibuprofen in a 24 hour period.  Do not take ibuprofen containing products if you have history of kidney disease, ulcers, GI bleeding, severe acid reflux, or take a blood thinner.  Do not take acetaminophen if you have liver disease.   Use your post op shoe for the next 72 hours with crutches to avoid putting weight on foot and help rest/inflammation.  Discontinue boot and crutches slowly as pain improves and as tolerated  Follow up with podiatry or foot/ankle specialist in 1 week if symptoms do not improve  Return to ER for leg or calf swelling, redness, warmth,chest pain, shortness of breath

## 2019-10-07 NOTE — ED Triage Notes (Signed)
Pt reports R foot and ankle pain x 1 week.  Denies known injury.  States she was seen at [facility] 1 week ago and told it was tendonitis.  Reports fall x 1 last night after ankle "gave out".

## 2019-10-07 NOTE — ED Provider Notes (Signed)
[facility] EMERGENCY DEPARTMENT Provider Note   CSN: [id number] Arrival date & time: 10/07/19  0751     History Chief Complaint  Patient presents with  . Foot Pain  . Ankle Pain    Julie Huber is a 24 y.o. female presents to the ED for evaluation of right foot pain for the last 1.5 weeks.  Associated with mild swelling.  The pain radiates up to the right ankle.  It is intermittent.  She actually has no pain when sitting and pain only comes on when she puts weight on her foot and tries to walk.  Patient seen for the same in urgent care a week ago and had x-rays that were normal.  She was instructed to come to the ED if the pain worsens.  Has called foot and ankle doctor but has been unable to make an appointment for a visit in clinic.  Reports working at [employer] and standing on her feet her whole shift.  Patient is 170 kg.  Denies any recent falls.  Denies any previous fractures or dislocations of the foot, surgeries.  Denies any associated calf pain, swelling, chest pain or shortness of breath.  No joint redness or warmth.  Reports when she sits down her right leg has some tingling and numbness.  This has been on for 2 months.  Reports history of sciatica in this leg.  HPI     Past Medical History:  Diagnosis Date  . Anxiety   . Back pain   . Depression     There are no problems to display for this patient.   Past Surgical History:  Procedure Laterality Date  . TONSILLECTOMY       OB History   No obstetric history on file.     No family history on file.  Social History   Tobacco Use  . Smoking status: Never Smoker  . Smokeless tobacco: Never Used  Substance Use Topics  . Alcohol use: Not Currently  . Drug use: Never    Home Medications Prior to Admission medications   Medication Sig Start Date End Date Taking? Authorizing Provider  dicyclomine (BENTYL) 20 MG tablet Take 1 tablet (20 mg total) by mouth every 12 (twelve) hours as  needed (for abdominal pain/cramping). Patient not taking: Reported on 09/09/2018 02/20/18   Antony Madura, PA-C  ibuprofen (ADVIL,MOTRIN) 600 MG tablet Take 1 tablet (600 mg total) by mouth every 6 (six) hours as needed for mild pain or moderate pain. Patient not taking: Reported on 09/09/2018 02/20/18   Antony Madura, PA-C  methocarbamol (ROBAXIN) 500 MG tablet Take 1 tablet (500 mg total) by mouth 2 (two) times daily. 10/15/18   Maxwell Caul, PA-C  naproxen (NAPROSYN) 500 MG tablet Take 1 tablet (500 mg total) by mouth 2 (two) times daily. Patient not taking: Reported on 10/09/2018 11/13/17   Janne Napoleon, NP  polyethylene glycol (MIRALAX) 17 g packet Take 17 g by mouth 3 (three) times daily. Take Miralax up the 3 times daily until bowels move, for a maximum 3 consecutive days. 10/09/18   Elpidio Anis, PA-C  predniSONE (DELTASONE) 50 MG tablet Take 1 tablet (50 mg total) by mouth daily. Patient not taking: Reported on 09/09/2018 03/25/18   Charlestine Night, PA-C  ranitidine (ZANTAC) 150 MG tablet Take 1 tablet (150 mg total) by mouth 2 (two) times daily. Patient not taking: Reported on 11/26/2017 10/30/17   Gilda Crease, MD  SPRINTEC 28 0.25-35 MG-MCG tablet  Take 1 tablet by mouth daily. 07/28/18   [provider]  traMADol (ULTRAM) 50 MG tablet Take 1 tablet (50 mg total) by mouth every 6 (six) hours as needed for severe pain. Patient not taking: Reported on 09/09/2018 03/25/18   Charlestine Night, PA-C    Allergies    Penicillins  Review of Systems   Review of Systems  Musculoskeletal: Positive for arthralgias, gait problem and joint swelling.  All other systems reviewed and are negative.   Physical Exam Updated Vital Signs BP (!) 141/90   Pulse 100   Temp 98.1 F (36.7 C) (Oral)   Resp 17   Ht 5\' 5"  (1.651 m)   Wt (!) 167.8 kg   SpO2 95%   BMI 61.57 kg/m   Physical Exam Constitutional:      Appearance: She is well-developed.  HENT:     Head:  Normocephalic.     Nose: Nose normal.  Eyes:     General: Lids are normal.  Cardiovascular:     Rate and Rhythm: Normal rate.     Comments: 1+ DP and PT pulses feet bilaterally  Pulmonary:     Effort: Pulmonary effort is normal. No respiratory distress.  Musculoskeletal:        General: Normal range of motion.     Cervical back: Normal range of motion.       Feet:  Feet:     Comments: Mild TTP and edema along proximal and base of 5th metatarsal. Milder tenderness and edema along lateral malleolus.  No pain with ankle F/E, inversion, eversion.   Skin:    Comments: Skin is normal in right foot/ankle, no cellulitis, lesions, abscess   Neurological:     Mental Status: She is alert.     Comments: Sensation and strength intact in feet bilaterally  Psychiatric:        Behavior: Behavior normal.     ED Results / Procedures / Treatments   Labs (all labs ordered are listed, but only abnormal results are displayed) Labs Reviewed - No data to display  EKG None  Radiology DG Foot Complete Right  Result Date: 10/07/2019 CLINICAL DATA:  Atraumatic RIGHT 5th metatarsal and LATERAL ankle pain. EXAM: RIGHT FOOT COMPLETE - 3+ VIEW COMPARISON:  03/25/2018 FINDINGS: There is no evidence of fracture or dislocation. There is no evidence of arthropathy or other focal bone abnormality. Soft tissues are unremarkable. IMPRESSION: Negative. Electronically Signed   By: 03/27/2018 M.D.   On: 10/07/2019 09:52    Procedures Procedures (including critical care time)  Medications Ordered in ED Medications  oxyCODONE-acetaminophen (PERCOCET/ROXICET) 5-325 MG per tablet 1 tablet (1 tablet Oral Given 10/07/19 12/07/19)    ED Course  I have reviewed the triage vital signs and the nursing notes.  Pertinent labs & imaging results that were available during my care of the patient were reviewed by me and considered in my medical decision making (see chart for details).    MDM Rules/Calculators/A&P                           Atraumatic right 5th metatarsal and lateral ankle pain with weight bearing only.  Exam shows mild local edema and tenderness. X-rays are non acute.  Highest suspicion is MSK etiology like occult stress fracture, soft tissue overuse injury.  Patient's BMI is 61.57 and stands at work.  No history of DVT/PE. No calf tenderness, asymmetry.  Doubt cellulitis, abscess, DVT, septic arthritis,  gout. Will dc with post op shoe, crutches, NSAID, RICE, fu with ortho. Return precautions given.  Final Clinical Impression(s) / ED Diagnoses Final diagnoses:  Right foot pain    Rx / DC Orders ED Discharge Orders    None       Liberty Handy, PA-C 10/07/19 1240    Margarita Grizzle, MD 10/07/19 1540

## 2019-12-01 IMAGING — CR LUMBAR SPINE - COMPLETE 4+ VIEW
5 series · 5 of 5 positions shown · non-contrast
Comparison: CT dated September 09, 2018

CLINICAL DATA: Back pain

EXAM:
LUMBAR SPINE - COMPLETE 4+ VIEW

[l-spine ap]
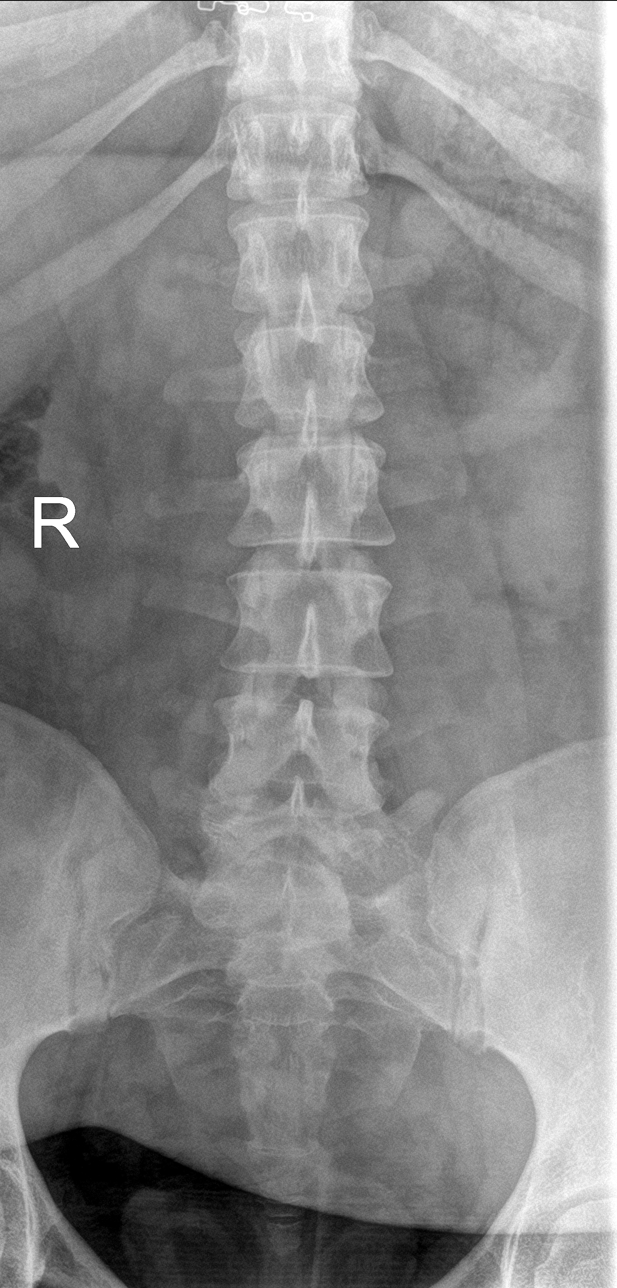

[l-spine obl (1 of 2)]
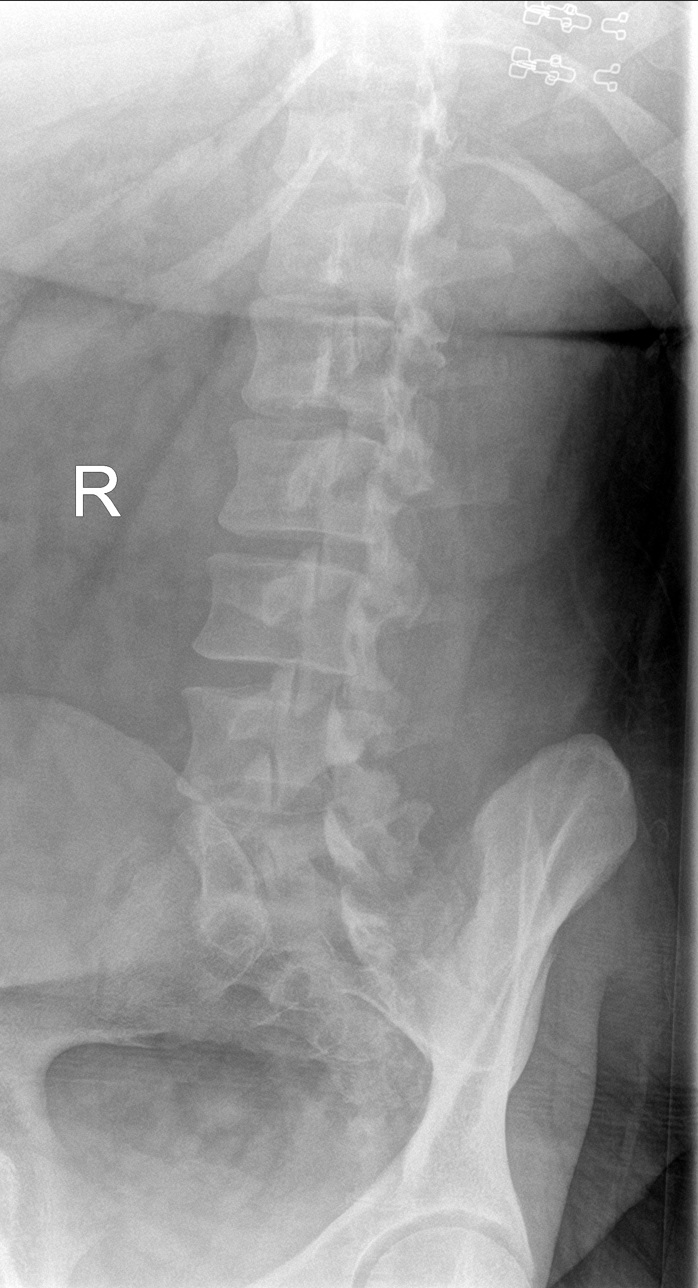

[l-spine obl (2 of 2)]
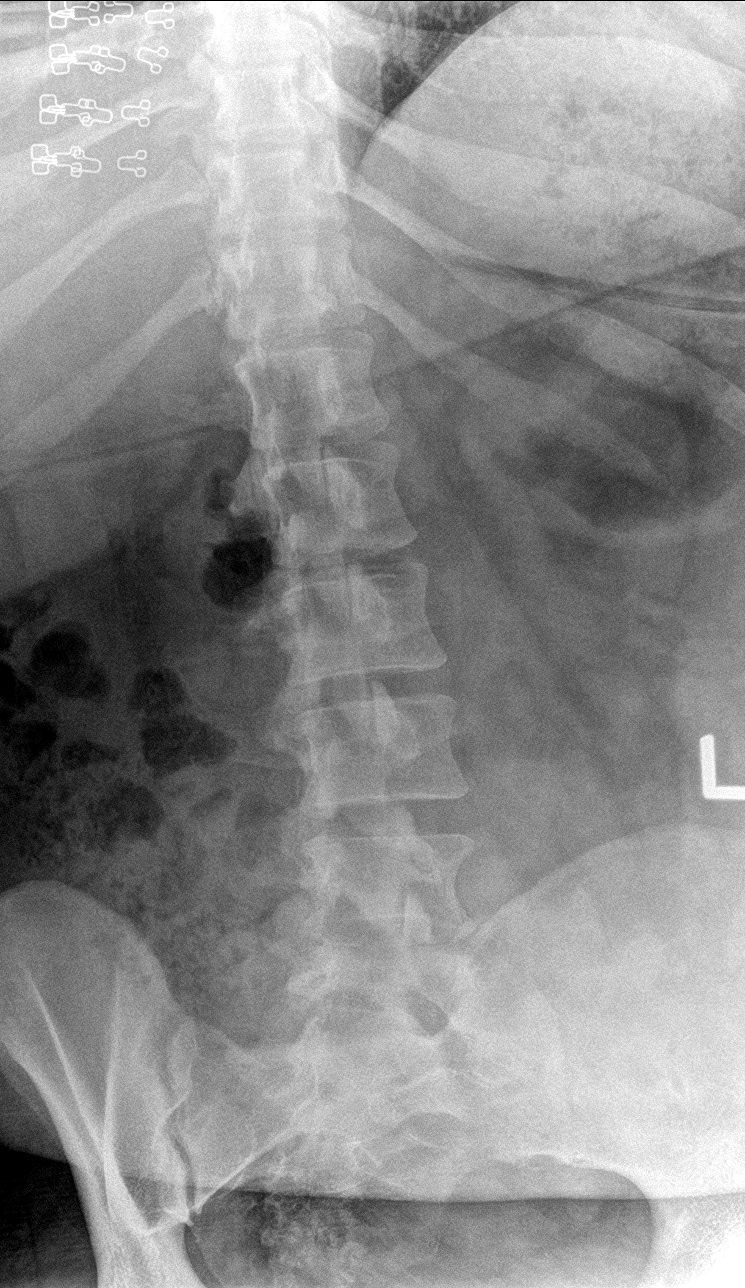

[l-spine lat]
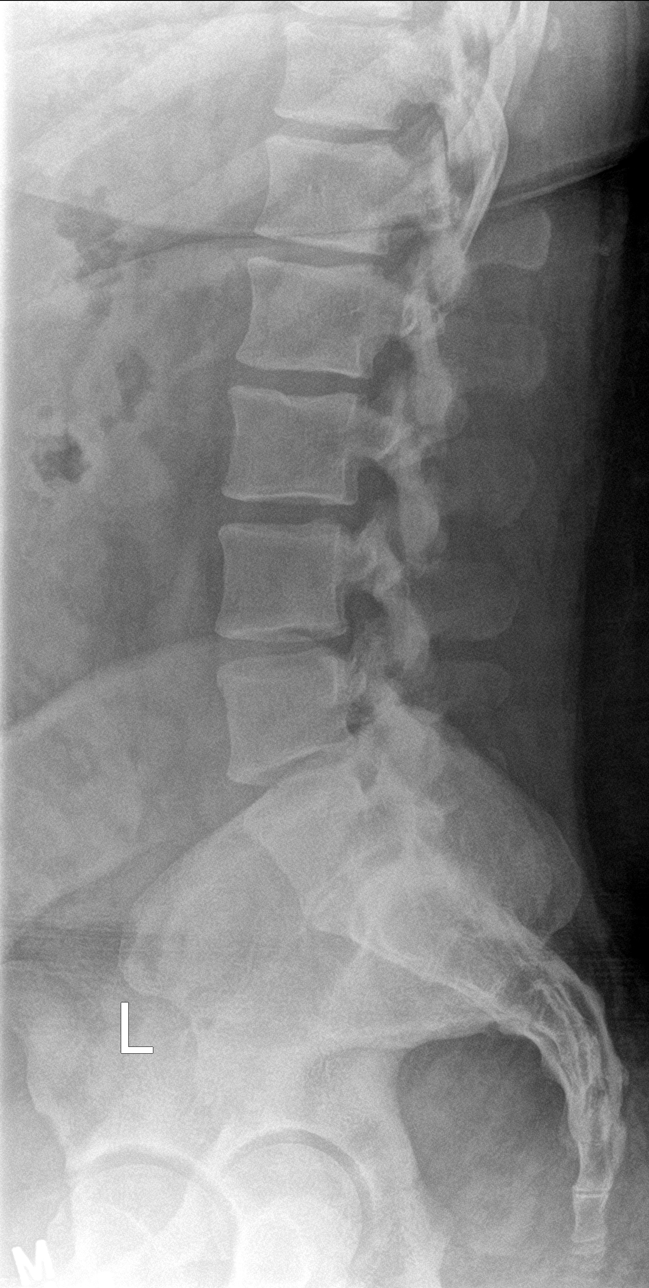

[l-spine spot]
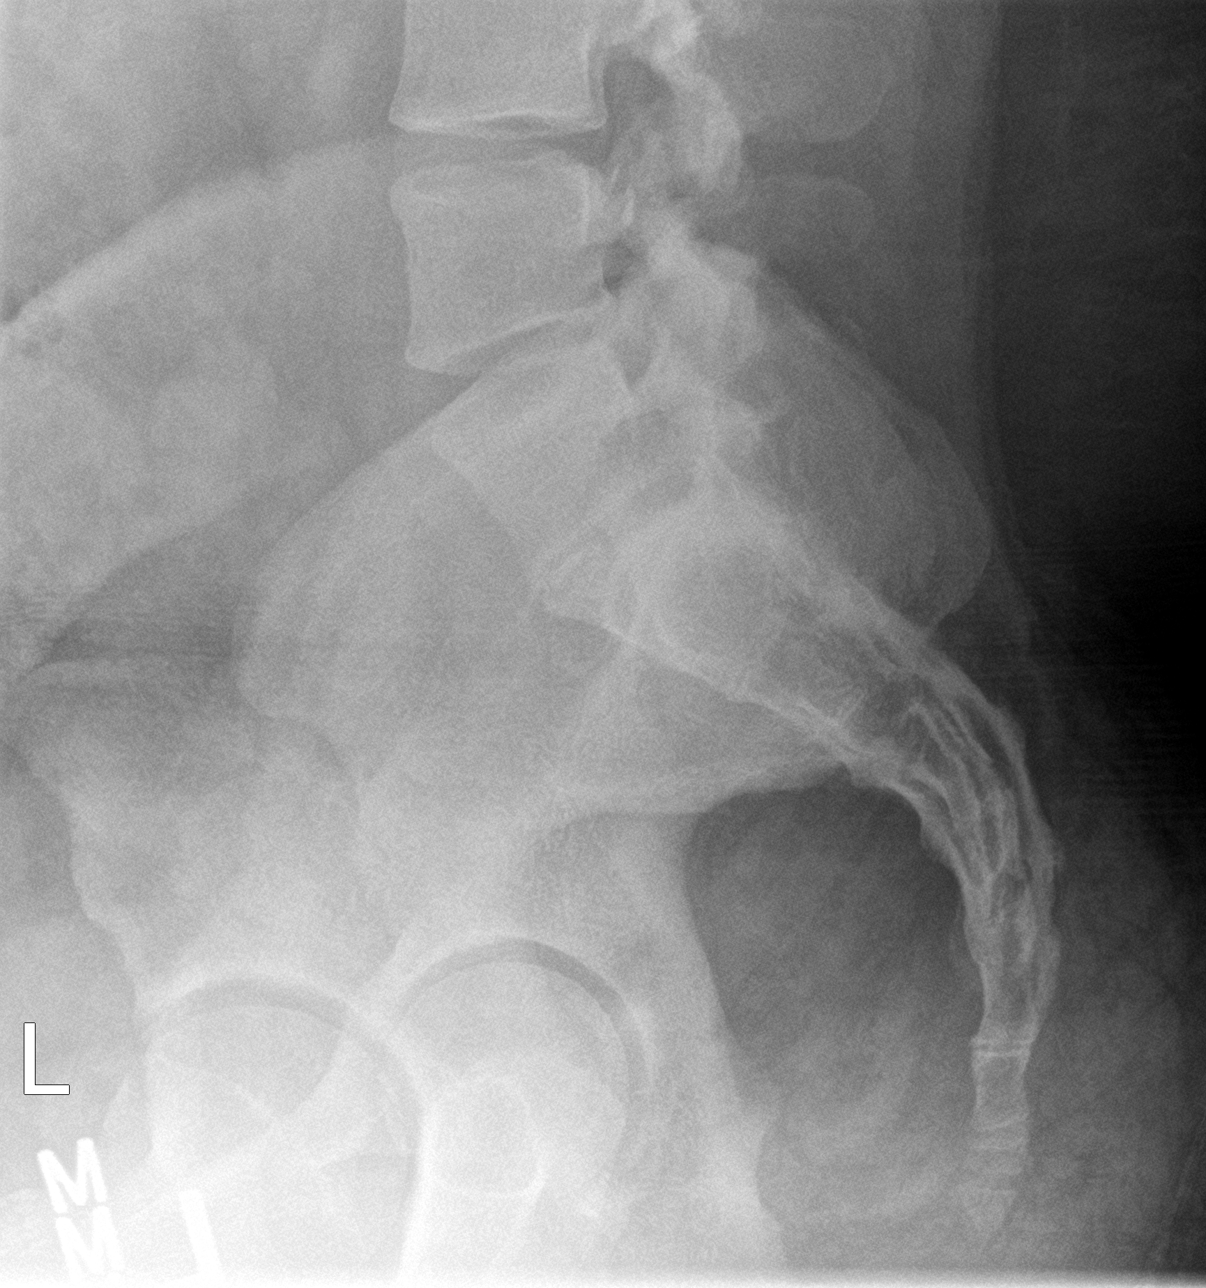

[5 of 5 positions shown; findings below may reference images not displayed]

FINDINGS: There is potentially ambiguous vertebral body numbering. For the
purpose of this report, there is lumbarization of the S1 vertebral
body. There is no displaced fracture. No dislocation. The disc
heights are relatively well preserved with mild disc height loss at
the L5-S1 level.
IMPRESSION: No acute osseous abnormality.

## 2019-12-27 ENCOUNTER — Encounter (HOSPITAL_COMMUNITY): Payer: Self-pay | Admitting: Emergency Medicine

## 2019-12-27 ENCOUNTER — Other Ambulatory Visit: Payer: Self-pay

## 2019-12-27 ENCOUNTER — Emergency Department (HOSPITAL_COMMUNITY)
Admission: EM | Admit: 2019-12-27 | Discharge: 2019-12-28 | Disposition: A | Discharge status: Home / Self Care | Payer: Medicaid Other | Attending: Emergency Medicine | Admitting: Emergency Medicine

## 2019-12-27 DIAGNOSIS — R109 Unspecified abdominal pain: Secondary | ICD-10-CM

## 2019-12-27 DIAGNOSIS — R1011 Right upper quadrant pain: Secondary | ICD-10-CM | POA: Diagnosis present

## 2019-12-27 DIAGNOSIS — R202 Paresthesia of skin: Secondary | ICD-10-CM | POA: Diagnosis not present

## 2019-12-27 DIAGNOSIS — R079 Chest pain, unspecified: Secondary | ICD-10-CM | POA: Diagnosis not present

## 2019-12-27 LAB — I-STAT BETA HCG BLOOD, ED (MC, WL, AP ONLY): I-stat hCG, quantitative: <5 m[IU]/mL (ref <5)

## 2019-12-27 LAB — COMPREHENSIVE METABOLIC PANEL
ALT: 27 U/L (ref 0–44)
AST: 28 U/L (ref 15–41)
Albumin: 3.8 g/dL (ref 3.5–5.0)
Alkaline Phosphatase: 74 U/L (ref 38–126)
Anion gap: 11 (ref 5–15)
BUN: 9 mg/dL (ref 6–20)
CO2: 27 mmol/L (ref 22–32)
Calcium: 9.3 mg/dL (ref 8.9–10.3)
Chloride: 99 mmol/L (ref 98–111)
Creatinine, Ser: 0.68 mg/dL (ref 0.44–1.00)
GFR, Estimated: >60 mL/min (ref >60)
Glucose, Bld: 140 mg/dL — ABNORMAL HIGH (ref 70–99)
Potassium: 4.1 mmol/L (ref 3.5–5.1)
Sodium: 137 mmol/L (ref 135–145)
Total Bilirubin: 0.3 mg/dL (ref 0.3–1.2)
Total Protein: 7.3 g/dL (ref 6.5–8.1)

## 2019-12-27 LAB — URINALYSIS, ROUTINE W REFLEX MICROSCOPIC
Bilirubin Urine: NEGATIVE
Glucose, UA: NEGATIVE mg/dL
Hgb urine dipstick: NEGATIVE
Ketones, ur: NEGATIVE mg/dL
Nitrite: NEGATIVE
Protein, ur: NEGATIVE mg/dL
Specific Gravity, Urine: 1.028 (ref 1.005–1.030)
pH: 5 (ref 5.0–8.0)

## 2019-12-27 LAB — CBC
HCT: 43.3 % (ref 36.0–46.0)
Hemoglobin: 13.5 g/dL (ref 12.0–15.0)
MCH: 25.6 pg — ABNORMAL LOW (ref 26.0–34.0)
MCHC: 31.2 g/dL (ref 30.0–36.0)
MCV: 82 fL (ref 80.0–100.0)
Platelets: 383 10*3/uL (ref 150–400)
RBC: 5.28 MIL/uL — ABNORMAL HIGH (ref 3.87–5.11)
RDW: 13.6 % (ref 11.5–15.5)
WBC: 12.4 10*3/uL — ABNORMAL HIGH (ref 4.0–10.5)
nRBC: 0 % (ref 0.0–0.2)

## 2019-12-27 LAB — TROPONIN I (HIGH SENSITIVITY)
Troponin I (High Sensitivity): 4 ng/L (ref <18)
Troponin I (High Sensitivity): 4 ng/L (ref <18)

## 2019-12-27 LAB — LIPASE, BLOOD: Lipase: 27 U/L (ref 11–51)

## 2019-12-27 MED ORDER — LACTATED RINGERS IV BOLUS
1000.0000 mL | Freq: Once | INTRAVENOUS | Status: AC
  Administered 2019-12-27: 1000 mL via INTRAVENOUS

## 2019-12-27 MED ORDER — FENTANYL CITRATE (PF) 100 MCG/2ML IJ SOLN
100.0000 ug | Freq: Once | INTRAMUSCULAR | Status: AC
  Administered 2019-12-27: 100 ug via INTRAVENOUS
  Filled 2019-12-27: qty 2

## 2019-12-27 NOTE — ED Triage Notes (Signed)
Pt c/o left side cp and abd pain with nausea.

## 2019-12-28 ENCOUNTER — Emergency Department (HOSPITAL_COMMUNITY): Payer: Medicaid Other

## 2019-12-28 ENCOUNTER — Encounter (HOSPITAL_COMMUNITY): Payer: Self-pay

## 2019-12-28 MED ORDER — IOHEXOL 350 MG/ML SOLN
100.0000 mL | Freq: Once | INTRAVENOUS | Status: AC | PRN
  Administered 2019-12-28: 100 mL via INTRAVENOUS

## 2019-12-28 NOTE — ED Notes (Signed)
Patient transported to CT 

## 2019-12-28 NOTE — ED Notes (Signed)
Patient transported to Ultrasound 

## 2019-12-28 NOTE — ED Provider Notes (Signed)
[facility] Provider Note   CSN: [id number] Arrival date & time: 12/27/19  1947     History Chief Complaint  Patient presents with  . Abdominal Pain  . Chest Pain    Julie Huber is a 24 y.o. female.  24 year old female here with 2 complaints.  Sounds that she has had about a few weeks worth of abdominal pain.  States is right upper quadrant.  Start with an a few minutes of eating.  She also has diarrhea with that.  She seen her doctor for nuclear might be her gallbladder and has been running a bunch of tests however there is been no conclusion.  Patient states that today she was working at [employer] where she lifts heavy things and she is lifted something and shortly after started having some left-sided chest pain.  This also was associated with some left arm paresthesia down to the elbow twice along with one episode of left arm cramping.  Little shortness of breath with that.  No diaphoresis, nausea, vomiting, syncope or lightheadedness.  Patient states she did not radiate that before.  She had no trauma.  She states that she has no known medical problems aside from depression.  No changes in her medications recently.  No alcohol, drugs or tobacco.  No lower extremity pain or swelling   Abdominal Pain Associated symptoms: chest pain   Chest Pain Associated symptoms: abdominal pain        Past Medical History:  Diagnosis Date  . Anxiety   . Back pain   . Depression     There are no problems to display for this patient.   Past Surgical History:  Procedure Laterality Date  . TONSILLECTOMY       OB History   No obstetric history on file.     No family history on file.  Social History   Tobacco Use  . Smoking status: Never Smoker  . Smokeless tobacco: Never Used  Substance Use Topics  . Alcohol use: Not Currently  . Drug use: Never    Home Medications Prior to Admission medications   Medication Sig Start Date End  Date Taking? Authorizing Provider  busPIRone (BUSPAR) 10 MG tablet Take 1 tablet by mouth daily.   Yes [provider]  FLUoxetine (PROZAC) 20 MG tablet Take 60 mg by mouth daily. 05/03/19  Yes [provider]  SPRINTEC 28 0.25-35 MG-MCG tablet Take 1 tablet by mouth daily. 07/28/18 12/28/19  [provider]    Allergies    Penicillins  Review of Systems   Review of Systems  Cardiovascular: Positive for chest pain.  Gastrointestinal: Positive for abdominal pain.  All other systems reviewed and are negative.   Physical Exam Updated Vital Signs BP 116/84 (BP Location: Right Arm)   Pulse (!) 101   Temp 97.6 F (36.4 C) (Oral)   Resp (!) 21   Ht 5\' 5"  (1.651 m)   Wt (!) 167.8 kg   SpO2 94%   BMI 61.56 kg/m   Physical Exam Vitals and nursing note reviewed.  Constitutional:      Appearance: She is well-developed.  HENT:     Head: Normocephalic and atraumatic.     Mouth/Throat:     Mouth: Mucous membranes are moist.     Pharynx: Oropharynx is clear.  Eyes:     Pupils: Pupils are equal, round, and reactive to light.  Cardiovascular:     Rate and Rhythm: Normal rate and regular  rhythm.  Pulmonary:     Effort: No respiratory distress.     Breath sounds: No stridor.  Abdominal:     General: There is no distension.     Tenderness: There is abdominal tenderness (RUQ).  Musculoskeletal:        General: No swelling or tenderness. Normal range of motion.     Cervical back: Normal range of motion.  Skin:    General: Skin is warm and dry.  Neurological:     General: No focal deficit present.     Mental Status: She is alert.     ED Results / Procedures / Treatments   Labs (all labs ordered are listed, but only abnormal results are displayed) Labs Reviewed  COMPREHENSIVE METABOLIC PANEL - Abnormal; Notable for the following components:      Result Value   Glucose, Bld 140 (*)    All other components within normal limits  CBC - Abnormal; Notable  for the following components:   WBC 12.4 (*)    RBC 5.28 (*)    MCH 25.6 (*)    All other components within normal limits  URINALYSIS, ROUTINE W REFLEX MICROSCOPIC - Abnormal; Notable for the following components:   APPearance HAZY (*)    Leukocytes,Ua TRACE (*)    Bacteria, UA RARE (*)    All other components within normal limits  LIPASE, BLOOD  I-STAT BETA HCG BLOOD, ED ([facility], [facility], [facility] ONLY)  TROPONIN I (HIGH SENSITIVITY)  TROPONIN I (HIGH SENSITIVITY)    EKG EKG Interpretation  Date/Time:  Thursday December 27 2019 19:46:01 EDT Ventricular Rate:  122 PR Interval:  168 QRS Duration: 80 QT Interval:  302 QTC Calculation: 430 R Axis:   64 Text Interpretation: Sinus tachycardia Otherwise normal ECG No acute changes Confirmed by Marily Memos [phone]) on 12/27/2019 11:02:03 PM   Radiology CT Angio Chest PE W and/or Wo Contrast  Result Date: 12/28/2019 CLINICAL DATA:  Upper abdominal pain shortness of breath EXAM: CT chest, ABDOMEN AND PELVIS WITH CONTRAST TECHNIQUE: Multidetector CT imaging of the chest, abdomen and pelvis was performed using the standard protocol following bolus administration of intravenous contrast. CONTRAST:  OMNIPAQUE IOHEXOL 350 MG/ML SOLN COMPARISON:  None. FINDINGS: Cardiovascular: There is slightly suboptimal opacification of the main pulmonary artery, however no central or segmental pulmonary embolism. The heart is normal in size. No pericardial effusion or thickening. No evidence right heart strain. There is normal three-vessel brachiocephalic anatomy without proximal stenosis. The thoracic aorta is normal in appearance. Mediastinum/Nodes: No hilar, mediastinal, or axillary adenopathy. Thyroid gland, trachea, and esophagus demonstrate no significant findings. Lungs/Pleura: The lungs are clear. No pleural effusion or pneumothorax. No airspace consolidation. Upper Abdomen: No acute abnormalities present in the visualized portions of the upper abdomen.  Musculoskeletal: No chest wall abnormality. No acute or significant osseous findings. Review of the MIP images confirms the above findings. Abdomen/pelvis: The study is somewhat limited due to body habitus. Hepatobiliary: The liver is normal in density without focal abnormality.The main portal vein is patent. No evidence of calcified gallstones, gallbladder wall thickening or biliary dilatation. Pancreas: Unremarkable. No pancreatic ductal dilatation or surrounding inflammatory changes. Spleen: Normal in size without focal abnormality. Adrenals/Urinary Tract: Both adrenal glands appear normal. The kidneys and collecting system appear normal without evidence of urinary tract calculus or hydronephrosis. Bladder is unremarkable. Stomach/Bowel: The stomach, small bowel, and colon are normal in appearance. No inflammatory changes, wall thickening, or obstructive findings.The appendix is normal. Vascular/Lymphatic: There are no enlarged mesenteric,  retroperitoneal, or pelvic lymph nodes. No significant vascular findings are present. Reproductive: IUD seen within the endometrial canal. Other: No evidence of abdominal wall mass or hernia. Musculoskeletal: No acute or significant osseous findings. IMPRESSION: Somewhat limited exam due to body habitus and technique. Suboptimal opacification of the main pulmonary artery, however no central or proximal segmental pulmonary embolism. No acute intrathoracic, abdominal, or pelvic abnormality to explain the patient's symptoms. Electronically Signed   By: Jonna Clark M.D.   On: 12/28/2019 01:19   CT ABDOMEN PELVIS W CONTRAST  Result Date: 12/28/2019 CLINICAL DATA:  Upper abdominal pain shortness of breath EXAM: CT chest, ABDOMEN AND PELVIS WITH CONTRAST TECHNIQUE: Multidetector CT imaging of the chest, abdomen and pelvis was performed using the standard protocol following bolus administration of intravenous contrast. CONTRAST:  OMNIPAQUE IOHEXOL 350 MG/ML SOLN COMPARISON:   None. FINDINGS: Cardiovascular: There is slightly suboptimal opacification of the main pulmonary artery, however no central or segmental pulmonary embolism. The heart is normal in size. No pericardial effusion or thickening. No evidence right heart strain. There is normal three-vessel brachiocephalic anatomy without proximal stenosis. The thoracic aorta is normal in appearance. Mediastinum/Nodes: No hilar, mediastinal, or axillary adenopathy. Thyroid gland, trachea, and esophagus demonstrate no significant findings. Lungs/Pleura: The lungs are clear. No pleural effusion or pneumothorax. No airspace consolidation. Upper Abdomen: No acute abnormalities present in the visualized portions of the upper abdomen. Musculoskeletal: No chest wall abnormality. No acute or significant osseous findings. Review of the MIP images confirms the above findings. Abdomen/pelvis: The study is somewhat limited due to body habitus. Hepatobiliary: The liver is normal in density without focal abnormality.The main portal vein is patent. No evidence of calcified gallstones, gallbladder wall thickening or biliary dilatation. Pancreas: Unremarkable. No pancreatic ductal dilatation or surrounding inflammatory changes. Spleen: Normal in size without focal abnormality. Adrenals/Urinary Tract: Both adrenal glands appear normal. The kidneys and collecting system appear normal without evidence of urinary tract calculus or hydronephrosis. Bladder is unremarkable. Stomach/Bowel: The stomach, small bowel, and colon are normal in appearance. No inflammatory changes, wall thickening, or obstructive findings.The appendix is normal. Vascular/Lymphatic: There are no enlarged mesenteric, retroperitoneal, or pelvic lymph nodes. No significant vascular findings are present. Reproductive: IUD seen within the endometrial canal. Other: No evidence of abdominal wall mass or hernia. Musculoskeletal: No acute or significant osseous findings. IMPRESSION: Somewhat  limited exam due to body habitus and technique. Suboptimal opacification of the main pulmonary artery, however no central or proximal segmental pulmonary embolism. No acute intrathoracic, abdominal, or pelvic abnormality to explain the patient's symptoms. Electronically Signed   By: Jonna Clark M.D.   On: 12/28/2019 01:19   US Abdomen Limited RUQ (LIVER/GB)  Result Date: 12/28/2019 CLINICAL DATA:  Right upper quadrant pain x2 weeks. EXAM: ULTRASOUND ABDOMEN LIMITED RIGHT UPPER QUADRANT COMPARISON:  None. FINDINGS: Gallbladder: No gallstones or wall thickening visualized (2.9 mm). No sonographic Murphy sign noted by sonographer. Common bile duct: The common bile duct is not clearly visualized secondary to the patient's body habitus. Liver: No focal lesion identified. There is diffusely increased echogenicity of the liver parenchyma. Portal vein is patent on color Doppler imaging with normal direction of blood flow towards the liver. Other: None. IMPRESSION: 1. Limited study secondary to the patient's body habitus. 2. Fatty liver. Electronically Signed   By: Aram Candela M.D.   On: 12/28/2019 00:31    Procedures Procedures (including critical care time)  Medications Ordered in ED Medications  lactated ringers bolus 1,000 mL (0 mLs  Intravenous Stopped 12/28/19 0040)  fentaNYL (SUBLIMAZE) injection 100 mcg (100 mcg Intravenous Given 12/27/19 2335)  iohexol (OMNIPAQUE) 350 MG/ML injection 100 mL (100 mLs Intravenous Contrast Given 12/28/19 0056)    ED Course  I have reviewed the triage vital signs and the nursing notes.  Pertinent labs & imaging results that were available during my care of the patient were reviewed by me and considered in my medical decision making (see chart for details).    MDM Rules/Calculators/A&P                         Eval for abdominal issues. ACS unlikely with normal troponins and ecg. Doubt PE with relatively normal vital signs.  Work-up ultimately  unremarkable.  However patient did have some episodes of hypoxia seem to be related to sleeping and she has known sleep apnea.  She is stable at this time for discharge to follow-up with her primary doctor for further management of her chronic issues.  Final Clinical Impression(s) / ED Diagnoses Final diagnoses:  Abdominal pain  Chest pain, unspecified type    Rx / DC Orders ED Discharge Orders    None       Michaelene Dutan, Barbara Cower, MD 12/28/19 [id number]

## 2020-01-18 ENCOUNTER — Other Ambulatory Visit: Payer: Self-pay

## 2020-01-18 ENCOUNTER — Encounter (HOSPITAL_COMMUNITY): Payer: Self-pay

## 2020-01-18 ENCOUNTER — Emergency Department (HOSPITAL_COMMUNITY)
Admission: EM | Admit: 2020-01-18 | Discharge: 2020-01-19 | Disposition: A | Discharge status: Home / Self Care | Payer: Worker's Compensation | Attending: Emergency Medicine | Admitting: Emergency Medicine

## 2020-01-18 DIAGNOSIS — W540XXA Bitten by dog, initial encounter: Secondary | ICD-10-CM | POA: Diagnosis not present

## 2020-01-18 DIAGNOSIS — M545 Low back pain, unspecified: Secondary | ICD-10-CM | POA: Insufficient documentation

## 2020-01-18 DIAGNOSIS — Z23 Encounter for immunization: Secondary | ICD-10-CM | POA: Insufficient documentation

## 2020-01-18 DIAGNOSIS — Z79899 Other long term (current) drug therapy: Secondary | ICD-10-CM | POA: Insufficient documentation

## 2020-01-18 DIAGNOSIS — M25569 Pain in unspecified knee: Secondary | ICD-10-CM

## 2020-01-18 DIAGNOSIS — M25561 Pain in right knee: Secondary | ICD-10-CM | POA: Diagnosis present

## 2020-01-18 NOTE — ED Notes (Signed)
Pt has left.  ?

## 2020-01-18 NOTE — ED Triage Notes (Signed)
Pt arrives to ED w/ c/o dog bite to back and RLE. Pt does not know rabies vaccination status of dog.

## 2020-01-19 ENCOUNTER — Other Ambulatory Visit: Payer: Self-pay

## 2020-01-19 ENCOUNTER — Emergency Department (HOSPITAL_COMMUNITY): Payer: Medicaid Other | Attending: Physician Assistant

## 2020-01-19 LAB — POC URINE PREG, ED: Preg Test, Ur: NEGATIVE

## 2020-01-19 MED ORDER — RABIES VACCINE, PCEC IM SUSR
1.0000 mL | Freq: Once | INTRAMUSCULAR | Status: AC
  Administered 2020-01-19: 1 mL via INTRAMUSCULAR
  Filled 2020-01-19: qty 1

## 2020-01-19 MED ORDER — RABIES IMMUNE GLOBULIN 150 UNIT/ML IM INJ: 20.0000 [IU]/kg | INJECTION | Freq: Once | INTRAMUSCULAR | Status: DC

## 2020-01-19 MED ORDER — DOXYCYCLINE HYCLATE 100 MG PO TABS
100.0000 mg | ORAL_TABLET | Freq: Once | ORAL | Status: AC
  Administered 2020-01-19: 100 mg via ORAL
  Filled 2020-01-19: qty 1

## 2020-01-19 MED ORDER — DOXYCYCLINE HYCLATE 100 MG PO CAPS: 100.0000 mg | ORAL_CAPSULE | Freq: Two times a day (BID) | ORAL | 0 refills | Status: AC

## 2020-01-19 MED ORDER — TETANUS-DIPHTH-ACELL PERTUSSIS 5-2.5-18.5 LF-MCG/0.5 IM SUSY
0.5000 mL | PREFILLED_SYRINGE | Freq: Once | INTRAMUSCULAR | Status: AC
  Administered 2020-01-19: 0.5 mL via INTRAMUSCULAR
  Filled 2020-01-19: qty 0.5

## 2020-01-19 NOTE — ED Provider Notes (Signed)
[facility] EMERGENCY DEPARTMENT Provider Note   CSN: [id number] Arrival date & time: 01/18/20  1737     History Chief Complaint  Patient presents with  . Animal Bite    Julie Huber is a 24 y.o. female with noncontributory past medical history.  HPI Patient presents to emergency department today with chief complaint of animal bite to right leg and back happening x3 hours prior to arrival.  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 She did not see the owner's outside and left immediately.  She is unsure if the dog has rabies vaccine.  She has pain localized to the bites.  She describes it as an aching pain.  Pain is worse when walking or moving.  She rates the pain 5/10 in severity.  No medications for symptoms prior to arrival.  She denies any fever, chills, numbness, tingling, weakness.    Past Medical History:  Diagnosis Date  . Anxiety   . Back pain   . Depression     There are no problems to display for this patient.   Past Surgical History:  Procedure Laterality Date  . TONSILLECTOMY       OB History   No obstetric history on file.     No family history on file.  Social History   Tobacco Use  . Smoking status: Never Smoker  . Smokeless tobacco: Never Used  Substance Use Topics  . Alcohol use: Not Currently  . Drug use: Never    Home Medications Prior to Admission medications   Medication Sig Start Date End Date Taking? Authorizing Provider  busPIRone (BUSPAR) 10 MG tablet Take 1 tablet by mouth daily.    [provider]  doxycycline (VIBRAMYCIN) 100 MG capsule Take 1 capsule (100 mg total) by mouth 2 (two) times daily for 7 days. 01/19/20 01/26/20  Walisiewicz, Yvonna Alanis E, PA-C  FLUoxetine (PROZAC) 20 MG tablet Take 60 mg by mouth daily. 05/03/19   [provider]  SPRINTEC 28 0.25-35 MG-MCG tablet Take 1 tablet by mouth daily. 07/28/18 12/28/19  [provider]     Allergies    Penicillins  Review of Systems   Review of Systems All other systems are reviewed and are negative for acute change except as noted in the HPI.  Physical Exam Updated Vital Signs BP (!) 146/90   Pulse 89   Temp 98.1 F (36.7 C) (Oral)   Resp 18   Ht 5\' 5"  (1.651 m)   Wt (!) 170.1 kg   SpO2 98%   BMI 62.40 kg/m   Physical Exam Vitals and nursing note reviewed.  Constitutional:      Appearance: She is well-developed. She is not ill-appearing or toxic-appearing.  HENT:     Head: Normocephalic and atraumatic.     Nose: Nose normal.  Eyes:     General: No scleral icterus.       Right eye: No discharge.        Left eye: No discharge.     Conjunctiva/sclera: Conjunctivae normal.  Neck:     Vascular: No JVD.  Cardiovascular:     Rate and Rhythm: Normal rate and regular rhythm.     Pulses: Normal pulses.     Heart sounds: Normal heart sounds.  Pulmonary:     Effort: Pulmonary effort is normal.     Breath sounds: Normal breath sounds.  Abdominal:     General: There is no  distension.  Musculoskeletal:        General: Normal range of motion.     Cervical back: Normal range of motion.     Comments: Full range of motion of right lower extremity.  Neurovascularly intact distally to the wound.  Compartments are soft in right lower extremity.  Skin:    General: Skin is warm and dry.          Comments: 2 puncture wounds to posterior of right knee. No active bleeding  Neurological:     Mental Status: She is oriented to person, place, and time.     GCS: GCS eye subscore is 4. GCS verbal subscore is 5. GCS motor subscore is 6.     Comments: Sensation grossly intact to light touch in the lower extremities bilaterally. No saddle anesthesias. Strength 5/5 with flexion and extension at the bilateral hips, knees, and ankles. No noted gait deficit. Coordination intact with heel to shin testing.  Psychiatric:        Behavior: Behavior normal.     ED Results /  Procedures / Treatments   Labs (all labs ordered are listed, but only abnormal results are displayed) Labs Reviewed  POC URINE PREG, ED    EKG None  Radiology DG Knee 1-2 Views Right  Result Date: 01/19/2020 CLINICAL DATA:  Acute injury to RIGHT knee with pain. Initial encounter. EXAM: RIGHT KNEE - 1-2 VIEW COMPARISON:  None. FINDINGS: No fracture, subluxation or dislocation identified. There is no evidence of joint effusion. No radiopaque foreign bodies are identified. IMPRESSION: Negative. Electronically Signed   By: Harmon Pier M.D.   On: 01/19/2020 04:49    Procedures Procedures (including critical care time)  Medications Ordered in ED Medications  Tdap (BOOSTRIX) injection 0.5 mL (0.5 mLs Intramuscular Given 01/19/20 0341)  rabies vaccine (RABAVERT) injection 1 mL (1 mL Intramuscular Given 01/19/20 0339)  doxycycline (VIBRA-TABS) tablet 100 mg (100 mg Oral Given 01/19/20 6389)    ED Course  I have reviewed the triage vital signs and the nursing notes.  Pertinent labs & imaging results that were available during my care of the patient were reviewed by me and considered in my medical decision making (see chart for details).    MDM Rules/Calculators/A&P                          History provided by patient with additional history obtained from chart review.    Patient presents with laceration from a dog bite.  Pt wounds irrigated well with 18ga angiocath with sterile saline.  Wounds examined with visualization of the base and no foreign bodies seen.  Pt Alert and oriented, NAD, nontoxic, nonseptic appearing.  Capillary refill intact and pt without neurologic deficit. She is ambulatory with normal gait. x-rays without foreign body seen. Patient tetanus updated.  Patient rabies vaccine and immunoglobulin risk and benefit discussed.  She would rather have rabies vaccine for now and hold off on the immunoglobulin until she can follow-up with animal control to find out the status of  the dog's vaccinations.  Declines need for pain medicine. Pregnancy test negative.  She has an allergy to penicillin with reaction of lip swelling, therefore will cover with doxycycline.  The patient appears reasonably screened and/or stabilized for discharge and I doubt any other medical condition or other [facility] requiring further screening, evaluation, or treatment in the ED at this time prior to discharge. The patient is safe for discharge with strict return  precautions discussed. Recommend pcp follow up if needed.    Portions of this note were generated with Scientist, clinical (histocompatibility and immunogenetics). Dictation errors may occur despite best attempts at proofreading.    Final Clinical Impression(s) / ED Diagnoses Final diagnoses:  Knee pain  Dog bite, initial encounter    Rx / DC Orders ED Discharge Orders         Ordered    doxycycline (VIBRAMYCIN) 100 MG capsule  2 times daily        01/19/20 0428           Shanon Ace, PA-C 01/19/20 5183    Marily Memos, MD 01/19/20 2259

## 2020-01-19 NOTE — ED Notes (Signed)
Madelaine Bhat, [phone] would like an update when available

## 2020-01-19 NOTE — Discharge Instructions (Addendum)
-  Rabies schedule has been printed.  You can get the vaccines at your primary care doctor, or urgent care, or return to the ER.  -Prescription for doxycyline sent to the pharmacy. This is to cover you for possible infection from the dog wound.   You need to follow up with animal control about the rabies status of the dog.  If animal control can contact the owners and find out the dog had his rabies vaccine and you will not need the rabies immunoglobulin. The animal will need to be quarantined and animal control usually facilitates that with the owner.  Clean the wound daily with soap and water.  Watch for signs of infection which would include fever, chills, pus draining from the wound.  Those would be signs of need to have the wound evaluated by primary care doctor or return to the emergency department.                                  RABIES VACCINE FOLLOW UP  Patient's Name: Julie Huber                     Original Order Date:01/18/2020  Medical Record Number: [id number]  ED Physician: Marily Memos, MD Primary Diagnosis: Rabies Exposure       PCP: [facility]  Patient Phone Number: (home) [phone] (home)    (cell)  Telephone Information:  Mobile [phone]    (work) There is no work phone number on file. Species of Animal: Dog   You have been seen in the Emergency Department for a possible rabies exposure. It's very important you return for the additional vaccine doses.  Please call the clinic listed below for hours of operation.   Clinic that will administer your rabies vaccines:    DAY 0:  01/18/2020      DAY 3:  01/21/2020       DAY 7:  01/25/2020     DAY 14:  02/01/2020         The 5th vaccine injection is considered for immune compromised patients only.  DAY 28:  02/15/2020

## 2020-03-15 ENCOUNTER — Other Ambulatory Visit: Payer: Self-pay

## 2020-03-15 ENCOUNTER — Emergency Department (HOSPITAL_COMMUNITY)
Admission: EM | Admit: 2020-03-15 | Discharge: 2020-03-15 | Disposition: A | Discharge status: Home / Self Care | Payer: Medicaid Other | Attending: Emergency Medicine | Admitting: Emergency Medicine

## 2020-03-15 DIAGNOSIS — R110 Nausea: Secondary | ICD-10-CM | POA: Insufficient documentation

## 2020-03-15 DIAGNOSIS — R1084 Generalized abdominal pain: Secondary | ICD-10-CM | POA: Diagnosis present

## 2020-03-15 DIAGNOSIS — R197 Diarrhea, unspecified: Secondary | ICD-10-CM | POA: Diagnosis not present

## 2020-03-15 LAB — COMPREHENSIVE METABOLIC PANEL
ALT: 32 U/L (ref 0–44)
AST: 31 U/L (ref 15–41)
Albumin: 3.9 g/dL (ref 3.5–5.0)
Alkaline Phosphatase: 77 U/L (ref 38–126)
Anion gap: 9 (ref 5–15)
BUN: 11 mg/dL (ref 6–20)
CO2: 30 mmol/L (ref 22–32)
Calcium: 9 mg/dL (ref 8.9–10.3)
Chloride: 102 mmol/L (ref 98–111)
Creatinine, Ser: 0.7 mg/dL (ref 0.44–1.00)
GFR, Estimated: >60 mL/min (ref >60)
Glucose, Bld: 93 mg/dL (ref 70–99)
Potassium: 4.2 mmol/L (ref 3.5–5.1)
Sodium: 141 mmol/L (ref 135–145)
Total Bilirubin: 0.5 mg/dL (ref 0.3–1.2)
Total Protein: 7.9 g/dL (ref 6.5–8.1)

## 2020-03-15 LAB — CBC
HCT: 44.2 % (ref 36.0–46.0)
Hemoglobin: 13.6 g/dL (ref 12.0–15.0)
MCH: 26.1 pg (ref 26.0–34.0)
MCHC: 30.8 g/dL (ref 30.0–36.0)
MCV: 84.7 fL (ref 80.0–100.0)
Platelets: 256 10*3/uL (ref 150–400)
RBC: 5.22 MIL/uL — ABNORMAL HIGH (ref 3.87–5.11)
RDW: 13.2 % (ref 11.5–15.5)
WBC: 9.8 10*3/uL (ref 4.0–10.5)
nRBC: 0 % (ref 0.0–0.2)

## 2020-03-15 LAB — URINALYSIS, ROUTINE W REFLEX MICROSCOPIC
Bilirubin Urine: NEGATIVE
Glucose, UA: NEGATIVE mg/dL
Hgb urine dipstick: NEGATIVE
Ketones, ur: NEGATIVE mg/dL
Leukocytes,Ua: NEGATIVE
Nitrite: NEGATIVE
Protein, ur: NEGATIVE mg/dL
Specific Gravity, Urine: 1.02 (ref 1.005–1.030)
pH: 5 (ref 5.0–8.0)

## 2020-03-15 LAB — LIPASE, BLOOD: Lipase: 26 U/L (ref 11–51)

## 2020-03-15 LAB — I-STAT BETA HCG BLOOD, ED (MC, WL, AP ONLY): I-stat hCG, quantitative: <5 m[IU]/mL (ref <5)

## 2020-03-15 MED ORDER — DICYCLOMINE HCL 20 MG PO TABS: 20.0000 mg | ORAL_TABLET | Freq: Two times a day (BID) | ORAL | 0 refills | Status: AC

## 2020-03-15 MED ORDER — DICYCLOMINE HCL 10 MG PO CAPS
10.0000 mg | ORAL_CAPSULE | Freq: Once | ORAL | Status: AC
  Administered 2020-03-15: 10 mg via ORAL
  Filled 2020-03-15: qty 1

## 2020-03-15 NOTE — ED Provider Notes (Signed)
[facility]-EMERGENCY DEPT Provider Note   CSN: [id number] Arrival date & time: 03/15/20  1534     History Chief Complaint  Patient presents with  . Abdominal Pain    Julie Huber is a 25 y.o. female.  25 year old female with past medical history of anxiety and depression presents with complaint of generalized abdominal cramping with nausea and diarrhea.  Patient states that she has had watery stools for the past 3 days, did have a COVID exposure and had a test yesterday but is still awaiting results.  Denies fevers, chills, vomiting.  Patient is not taking Zofran and Pepto with improvement in her symptoms.  Patient states that she last had abdominal pain 2 hours ago and has been pain-free since.  Denies urinary symptoms.  No other complaints or concerns.  No prior abdominal surgeries.  Patient is vaccinated against COVID-19, did have COVID in September 2021.        Past Medical History:  Diagnosis Date  . Anxiety   . Back pain   . Depression     There are no problems to display for this patient.   Past Surgical History:  Procedure Laterality Date  . TONSILLECTOMY       OB History   No obstetric history on file.     No family history on file.  Social History   Tobacco Use  . Smoking status: Never Smoker  . Smokeless tobacco: Never Used  Substance Use Topics  . Alcohol use: Not Currently  . Drug use: Never    Home Medications Prior to Admission medications   Medication Sig Start Date End Date Taking? Authorizing Provider  dicyclomine (BENTYL) 20 MG tablet Take 1 tablet (20 mg total) by mouth 2 (two) times daily. 03/15/20  Yes Jeannie Fend, PA-C  busPIRone (BUSPAR) 10 MG tablet Take 1 tablet by mouth daily.    [provider]  FLUoxetine (PROZAC) 20 MG tablet Take 60 mg by mouth daily. 05/03/19   [provider]  SPRINTEC 28 0.25-35 MG-MCG tablet Take 1 tablet by mouth daily. 07/28/18 12/28/19  [provider]    Allergies    Penicillins  Review of Systems   Review of Systems  Constitutional: Negative for fever.  Respiratory: Negative for shortness of breath.   Cardiovascular: Negative for chest pain.  Gastrointestinal: Positive for abdominal pain, diarrhea and nausea. Negative for blood in stool and vomiting.  Genitourinary: Negative for decreased urine volume, dysuria and frequency.  Musculoskeletal: Negative for arthralgias and myalgias.  Skin: Negative for rash.  Allergic/Immunologic: Negative for immunocompromised state.  Neurological: Negative for weakness.  All other systems reviewed and are negative.   Physical Exam Updated Vital Signs BP 139/83 (BP Location: Right Arm)   Pulse 97   Temp 98.2 F (36.8 C) (Oral)   Resp 18   Ht 5\' 5"  (1.651 m)   Wt (!) 171.9 kg   SpO2 98%   BMI 63.07 kg/m   Physical Exam Vitals and nursing note reviewed.  Constitutional:      General: She is not in acute distress.    Appearance: She is well-developed and well-nourished. She is obese. She is not diaphoretic.  HENT:     Head: Normocephalic and atraumatic.  Cardiovascular:     Rate and Rhythm: Normal rate and regular rhythm.     Heart sounds: Normal heart sounds.  Pulmonary:     Effort: Pulmonary effort is normal.     Breath sounds: Normal breath  sounds.  Abdominal:     Palpations: Abdomen is soft.     Tenderness: There is no abdominal tenderness. There is no right CVA tenderness or left CVA tenderness.  Skin:    General: Skin is warm and dry.     Findings: No erythema or rash.  Neurological:     Mental Status: She is alert and oriented to person, place, and time.  Psychiatric:        Mood and Affect: Mood and affect normal.        Behavior: Behavior normal.     ED Results / Procedures / Treatments   Labs (all labs ordered are listed, but only abnormal results are displayed) Labs Reviewed  CBC - Abnormal; Notable for the following components:      Result  Value   RBC 5.22 (*)    All other components within normal limits  LIPASE, BLOOD  COMPREHENSIVE METABOLIC PANEL  URINALYSIS, ROUTINE W REFLEX MICROSCOPIC  I-STAT BETA HCG BLOOD, ED ([facility], [facility], [facility] ONLY)    EKG None  Radiology No results found.  Procedures Procedures (including critical care time)  Medications Ordered in ED Medications  dicyclomine (BENTYL) capsule 10 mg (10 mg Oral Given 03/15/20 2002)    ED Course  I have reviewed the triage vital signs and the nursing notes.  Pertinent labs & imaging results that were available during my care of the patient were reviewed by me and considered in my medical decision making (see chart for details).  Clinical Course as of 03/15/20 2002  Sat Mar 15, 2020  2251 25 year old female presents complaint of abdominal pain with nausea and watery/nonbloody diarrhea.  On exam the patient is well-appearing, her pain is currently resolved.  Abdomen is soft and nontender. Labs reviewed, blood pressure initially elevated on arrival, has improved.  Labs reassuring including CBC, CMP, urinalysis and lipase is normal limits, hCG negative. Discussed results with patient, offered Bentyl for her abdominal cramping.  No concerns for C. difficile infection, no recent antibiotics, recent travel, no recent exposure.  No prior abdominal surgeries.  Suspect viral illness.  Patient's COVID test is pending, she will await those results.  Prescription sent to her pharmacy.  Advised return to ED for fever or worsening pain or concerns otherwise follow-up with PCP. [LM]  2002 Julie Huber was evaluated in Emergency Department on 03/15/2020 for the symptoms described in the history of present illness. She was evaluated in the context of the global COVID-19 pandemic, which necessitated consideration that the patient might be at risk for infection with the SARS-CoV-2 virus that causes COVID-19. Institutional protocols and algorithms that pertain to the evaluation of  patients at risk for COVID-19 are in a state of rapid change based on information released by regulatory bodies including the CDC and federal and state organizations. These policies and algorithms were followed during the patient's care in the ED.   [LM]    Clinical Course User Index [LM] Alden Hipp   MDM Rules/Calculators/A&P                          Final Clinical Impression(s) / ED Diagnoses Final diagnoses:  Generalized abdominal pain  Diarrhea, unspecified type    Rx / DC Orders ED Discharge Orders         Ordered    dicyclomine (BENTYL) 20 MG tablet  2 times daily        03/15/20 1953  Jeannie Fend, PA-C 03/15/20 2002    Terrilee Files, MD 03/16/20 1002

## 2020-03-15 NOTE — Discharge Instructions (Addendum)
Home to rest and quarantine pending COVID test results.  Take Bentyl as needed as prescribed.  Return to ED for fever, worsening or concerning symptoms.  Follow-up with your PCP for recheck.  Take Zofran as needed as prescribed for nausea and vomiting.

## 2020-03-15 NOTE — ED Triage Notes (Signed)
Patient c/o abdominal pain with nausea and diarrhea x3 days. Denies vomiting.

## 2020-07-19 ENCOUNTER — Emergency Department (HOSPITAL_BASED_OUTPATIENT_CLINIC_OR_DEPARTMENT_OTHER): Payer: Medicaid Other | Admitting: Radiology

## 2020-07-19 ENCOUNTER — Emergency Department (HOSPITAL_BASED_OUTPATIENT_CLINIC_OR_DEPARTMENT_OTHER)
Admission: EM | Admit: 2020-07-19 | Discharge: 2020-07-19 | Disposition: A | Discharge status: Home / Self Care | Payer: Medicaid Other | Attending: Emergency Medicine | Admitting: Emergency Medicine

## 2020-07-19 ENCOUNTER — Other Ambulatory Visit: Payer: Self-pay

## 2020-07-19 DIAGNOSIS — S82891A Other fracture of right lower leg, initial encounter for closed fracture: Secondary | ICD-10-CM

## 2020-07-19 DIAGNOSIS — W010XXA Fall on same level from slipping, tripping and stumbling without subsequent striking against object, initial encounter: Secondary | ICD-10-CM | POA: Diagnosis not present

## 2020-07-19 DIAGNOSIS — S93401A Sprain of unspecified ligament of right ankle, initial encounter: Secondary | ICD-10-CM

## 2020-07-19 DIAGNOSIS — S92151A Displaced avulsion fracture (chip fracture) of right talus, initial encounter for closed fracture: Secondary | ICD-10-CM | POA: Diagnosis not present

## 2020-07-19 DIAGNOSIS — S99911A Unspecified injury of right ankle, initial encounter: Secondary | ICD-10-CM | POA: Diagnosis present

## 2020-07-19 NOTE — ED Provider Notes (Signed)
[facility] EMERGENCY DEPT Provider Note   CSN: [id number] Arrival date & time: 07/19/20  1942     History Chief Complaint  Patient presents with  . Ankle Pain    Julie Huber is a 25 y.o. female.  HPI 25 year old female presents with right ankle pain and injury.  She was walking in her driveway and did not see a rock and twisted her ankle and fell.  Is having lateral ankle pain.  This occurred just prior to arrival.  No numbness or weakness.  She is able to walk on it but is quite painful.  No other injuries.  Past Medical History:  Diagnosis Date  . Anxiety   . Back pain   . Depression     There are no problems to display for this patient.   Past Surgical History:  Procedure Laterality Date  . TONSILLECTOMY       OB History   No obstetric history on file.     No family history on file.  Social History   Tobacco Use  . Smoking status: Never Smoker  . Smokeless tobacco: Never Used  Substance Use Topics  . Alcohol use: Not Currently  . Drug use: Never    Home Medications Prior to Admission medications   Medication Sig Start Date End Date Taking? Authorizing Provider  busPIRone (BUSPAR) 10 MG tablet Take 1 tablet by mouth daily.    [provider]  dicyclomine (BENTYL) 20 MG tablet Take 1 tablet (20 mg total) by mouth 2 (two) times daily. 03/15/20   Jeannie Fend, PA-C  FLUoxetine (PROZAC) 20 MG tablet Take 60 mg by mouth daily. 05/03/19   [provider]  SPRINTEC 28 0.25-35 MG-MCG tablet Take 1 tablet by mouth daily. 07/28/18 12/28/19  [provider]    Allergies    Penicillins  Review of Systems   Review of Systems  Musculoskeletal: Positive for arthralgias and joint swelling.  Neurological: Negative for weakness and numbness.    Physical Exam Updated Vital Signs BP (!) 142/98   Pulse (!) 108   Temp 98 F (36.7 C) (Oral)   Resp 17   Ht 5\' 5"  (1.651 m)   Wt (!) 166 kg   SpO2 98%   BMI 60.90  kg/m   Physical Exam Vitals and nursing note reviewed.  Constitutional:      Appearance: She is well-developed. She is obese.  HENT:     Head: Normocephalic and atraumatic.     Right Ear: External ear normal.     Left Ear: External ear normal.     Nose: Nose normal.  Eyes:     General:        Right eye: No discharge.        Left eye: No discharge.  Cardiovascular:     Rate and Rhythm: Normal rate and regular rhythm.     Pulses:          Dorsalis pedis pulses are 2+ on the right side.  Pulmonary:     Effort: Pulmonary effort is normal.  Abdominal:     General: There is no distension.  Musculoskeletal:     Right lower leg: No tenderness.     Right ankle: Swelling (lateral) present. Tenderness present over the lateral malleolus. Normal range of motion.     Right Achilles Tendon: No tenderness or defects.     Comments: Normal strength/sensation in right foot  Skin:    General: Skin is warm and dry.  Neurological:     Mental Status: She is alert.  Psychiatric:        Mood and Affect: Mood is not anxious.     ED Results / Procedures / Treatments   Labs (all labs ordered are listed, but only abnormal results are displayed) Labs Reviewed - No data to display  EKG None  Radiology DG Ankle Complete Right  Result Date: 07/19/2020 CLINICAL DATA:  Fall, right ankle pain EXAM: RIGHT ANKLE - COMPLETE 3+ VIEW COMPARISON:  Right foot x-ray 10/07/2019 FINDINGS: Small cortical densities adjacent to the lateral talus suggesting tiny avulsion fracture fragments. No additional fractures identified. Ankle mortise appears congruent. Suspect small tibiotalar joint effusion. Mild diffuse soft tissue swelling about the ankle. IMPRESSION: Small cortical densities adjacent to the lateral talus suggesting tiny avulsion fracture fragments. Correlate with point tenderness. Electronically Signed   By: Duanne Guess D.O.   On: 07/19/2020 20:29    Procedures Procedures   Medications Ordered in  ED Medications - No data to display  ED Course  I have reviewed the triage vital signs and the nursing notes.  Pertinent labs & imaging results that were available during my care of the patient were reviewed by me and considered in my medical decision making (see chart for details).    MDM Rules/Calculators/A&P                          Ankle x-ray images have been reviewed.  No significant fracture but there are tiny avulsion fracture fragments that are probably more representative of significant sprain.  Will place in cam walker.  She already has crutches and already has an orthopedist appointment for her other foot in a couple days.  She declines anything for pain.  Recommended NSAIDs and ice and elevation. Final Clinical Impression(s) / ED Diagnoses Final diagnoses:  Closed avulsion fracture of right ankle, initial encounter  Sprain of right ankle, unspecified ligament, initial encounter    Rx / DC Orders ED Discharge Orders    None       Pricilla Loveless, MD 07/19/20 2135

## 2020-07-19 NOTE — ED Triage Notes (Signed)
Pt is present to the ED for right ankle pain after she tripped on a rock and fell PTA.

## 2020-10-06 ENCOUNTER — Other Ambulatory Visit: Payer: Self-pay

## 2020-10-06 ENCOUNTER — Emergency Department (HOSPITAL_BASED_OUTPATIENT_CLINIC_OR_DEPARTMENT_OTHER)
Admission: EM | Admit: 2020-10-06 | Discharge: 2020-10-06 | Disposition: A | Discharge status: Home / Self Care | Payer: Medicaid Other | Attending: Emergency Medicine | Admitting: Emergency Medicine

## 2020-10-06 ENCOUNTER — Emergency Department (HOSPITAL_BASED_OUTPATIENT_CLINIC_OR_DEPARTMENT_OTHER): Payer: Medicaid Other | Admitting: Radiology

## 2020-10-06 ENCOUNTER — Encounter (HOSPITAL_BASED_OUTPATIENT_CLINIC_OR_DEPARTMENT_OTHER): Payer: Self-pay

## 2020-10-06 DIAGNOSIS — R000 Tachycardia, unspecified: Secondary | ICD-10-CM | POA: Insufficient documentation

## 2020-10-06 DIAGNOSIS — R0602 Shortness of breath: Secondary | ICD-10-CM | POA: Diagnosis present

## 2020-10-06 DIAGNOSIS — R079 Chest pain, unspecified: Secondary | ICD-10-CM

## 2020-10-06 LAB — D-DIMER, QUANTITATIVE: D-Dimer, Quant: 0.4 ug/mL-FEU (ref 0.00–0.50)

## 2020-10-06 LAB — BASIC METABOLIC PANEL
Anion gap: 8 (ref 5–15)
BUN: 16 mg/dL (ref 6–20)
CO2: 31 mmol/L (ref 22–32)
Calcium: 9.4 mg/dL (ref 8.9–10.3)
Chloride: 101 mmol/L (ref 98–111)
Creatinine, Ser: 0.66 mg/dL (ref 0.44–1.00)
GFR, Estimated: >60 mL/min (ref >60)
Glucose, Bld: 115 mg/dL — ABNORMAL HIGH (ref 70–99)
Potassium: 4.3 mmol/L (ref 3.5–5.1)
Sodium: 140 mmol/L (ref 135–145)

## 2020-10-06 LAB — CBC WITH DIFFERENTIAL/PLATELET
Abs Immature Granulocytes: 0.07 10*3/uL (ref 0.00–0.07)
Basophils Absolute: 0 10*3/uL (ref 0.0–0.1)
Basophils Relative: 0 %
Eosinophils Absolute: 0.3 10*3/uL (ref 0.0–0.5)
Eosinophils Relative: 3 %
HCT: 41 % (ref 36.0–46.0)
Hemoglobin: 13.1 g/dL (ref 12.0–15.0)
Immature Granulocytes: 1 %
Lymphocytes Relative: 34 %
Lymphs Abs: 3.8 10*3/uL (ref 0.7–4.0)
MCH: 26 pg (ref 26.0–34.0)
MCHC: 32 g/dL (ref 30.0–36.0)
MCV: 81.5 fL (ref 80.0–100.0)
Monocytes Absolute: 0.8 10*3/uL (ref 0.1–1.0)
Monocytes Relative: 8 %
Neutro Abs: 6.1 10*3/uL (ref 1.7–7.7)
Neutrophils Relative %: 54 %
Platelets: 337 10*3/uL (ref 150–400)
RBC: 5.03 MIL/uL (ref 3.87–5.11)
RDW: 13.9 % (ref 11.5–15.5)
WBC: 11.1 10*3/uL — ABNORMAL HIGH (ref 4.0–10.5)
nRBC: 0 % (ref 0.0–0.2)

## 2020-10-06 LAB — PREGNANCY, URINE: Preg Test, Ur: NEGATIVE

## 2020-10-06 NOTE — ED Provider Notes (Signed)
[facility] EMERGENCY DEPT Provider Note   CSN: [id number] Arrival date & time: 10/06/20  2035     History Chief Complaint  Patient presents with   Shortness of Breath    Julie Huber is a 25 y.o. female.  She is here with a complaint of nonproductive cough shortness of breath and cramping pain underneath her ribs that is been on and off for almost 12 months.  She attributes this to a COVID infection she got back then.  She talked to her primary care doctor about it was supposed to get a chest x-ray but they never scheduled.  Her primary care doctor is concerned she might have a blood clot.  No prior history of breathing issues.  Non-smoker.  No abdominal pain vomiting diarrhea or leg pain.  The history is provided by the patient.  Shortness of Breath Severity:  Severe Onset quality:  Gradual Duration:  11 months Timing:  Intermittent Progression:  Worsening Chronicity:  New Relieved by:  Nothing Worsened by:  Activity Ineffective treatments:  None tried Associated symptoms: chest pain and cough   Associated symptoms: no abdominal pain, no fever, no headaches, no hemoptysis, no neck pain, no rash, no sore throat and no sputum production   Risk factors: oral contraceptive use   Risk factors: no tobacco use       Past Medical History:  Diagnosis Date   Anxiety    Back pain    Depression     There are no problems to display for this patient.   Past Surgical History:  Procedure Laterality Date   TONSILLECTOMY       OB History   No obstetric history on file.     No family history on file.  Social History   Tobacco Use   Smoking status: Never   Smokeless tobacco: Never  Substance Use Topics   Alcohol use: Not Currently   Drug use: Never    Home Medications Prior to Admission medications   Medication Sig Start Date End Date Taking? Authorizing Provider  busPIRone (BUSPAR) 10 MG tablet Take 1 tablet by mouth daily.    [provider]  dicyclomine (BENTYL) 20 MG tablet Take 1 tablet (20 mg total) by mouth 2 (two) times daily. 03/15/20   Jeannie Fend, PA-C  FLUoxetine (PROZAC) 20 MG tablet Take 60 mg by mouth daily. 05/03/19   [provider]  SPRINTEC 28 0.25-35 MG-MCG tablet Take 1 tablet by mouth daily. 07/28/18 12/28/19  [provider]    Allergies    Penicillins  Review of Systems   Review of Systems  Constitutional:  Negative for fever.  HENT:  Negative for sore throat.   Eyes:  Negative for visual disturbance.  Respiratory:  Positive for cough and shortness of breath. Negative for hemoptysis and sputum production.   Cardiovascular:  Positive for chest pain.  Gastrointestinal:  Negative for abdominal pain.  Genitourinary:  Negative for dysuria.  Musculoskeletal:  Negative for neck pain.  Skin:  Negative for rash.  Neurological:  Negative for headaches.   Physical Exam Updated Vital Signs BP (!) 150/82   Pulse (!) 113   Temp 98.6 F (37 C) (Oral)   Resp 17   Ht 5\' 5"  (1.651 m)   Wt (!) 166 kg   SpO2 96%   BMI 60.90 kg/m   Physical Exam Vitals and nursing note reviewed.  Constitutional:      General: She is not in acute distress.  Appearance: Normal appearance. She is well-developed. She is obese.  HENT:     Head: Normocephalic and atraumatic.  Eyes:     Conjunctiva/sclera: Conjunctivae normal.  Cardiovascular:     Rate and Rhythm: Regular rhythm. Tachycardia present.     Heart sounds: No murmur heard. Pulmonary:     Effort: Pulmonary effort is normal. No respiratory distress.     Breath sounds: Normal breath sounds.  Abdominal:     Palpations: Abdomen is soft.     Tenderness: There is no abdominal tenderness. There is no guarding or rebound.  Musculoskeletal:        General: No deformity or signs of injury. Normal range of motion.     Cervical back: Neck supple.  Skin:    General: Skin is warm and dry.  Neurological:     General: No focal deficit  present.     Mental Status: She is alert.    ED Results / Procedures / Treatments   Labs (all labs ordered are listed, but only abnormal results are displayed) Labs Reviewed  BASIC METABOLIC PANEL - Abnormal; Notable for the following components:      Result Value   Glucose, Bld 115 (*)    All other components within normal limits  CBC WITH DIFFERENTIAL/PLATELET - Abnormal; Notable for the following components:   WBC 11.1 (*)    All other components within normal limits  PREGNANCY, URINE  D-DIMER, QUANTITATIVE  TSH    EKG EKG Interpretation  Date/Time:  Monday October 06 2020 20:57:30 EDT Ventricular Rate:  111 PR Interval:  174 QRS Duration: 86 QT Interval:  312 QTC Calculation: 424 R Axis:   62 Text Interpretation: Sinus tachycardia Otherwise normal ECG No significant change since prior 10/21 Confirmed by Meridee Score [phone]) on 10/06/2020 9:12:51 PM  Radiology DG Chest 2 View  Result Date: 10/06/2020 CLINICAL DATA:  Shortness of breath EXAM: CHEST - 2 VIEW COMPARISON:  10/29/2017 FINDINGS: Heart and mediastinal contours are within normal limits. No focal opacities or effusions. No acute bony abnormality. IMPRESSION: No active cardiopulmonary disease. Electronically Signed   By: Charlett Nose M.D.   On: 10/06/2020 21:12    Procedures Procedures   Medications Ordered in ED Medications - No data to display  ED Course  I have reviewed the triage vital signs and the nursing notes.  Pertinent labs & imaging results that were available during my care of the patient were reviewed by me and considered in my medical decision making (see chart for details).    MDM Rules/Calculators/A&P                          Julie Huber was evaluated in Emergency Department on 10/06/2020 for the symptoms described in the history of present illness. She was evaluated in the context of the global COVID-19 pandemic, which necessitated consideration that the patient might be at risk for  infection with the SARS-CoV-2 virus that causes COVID-19. Institutional protocols and algorithms that pertain to the evaluation of patients at risk for COVID-19 are in a state of rapid change based on information released by regulatory bodies including the CDC and federal and state organizations. These policies and algorithms were followed during the patient's care in the ED.  This patient complains of shortness of breath dyspnea on exertion chest pain; this involves an extensive number of treatment Options and is a complaint that carries with it a high risk of complications and Morbidity. The  differential includes pneumonia, PE, pneumothorax, long COVID syndrome, CHF, COPD, bronchitis  I ordered, reviewed and interpreted labs, which included CBC O'Malley elevated white count, normal hemoglobin, chemistries normal other than mildly elevated glucose, pregnancy test negative, D-dimer low I ordered imaging studies which included chest x-ray and I independently    visualized and interpreted imaging which showed no acute findings Previous records obtained and reviewed in epic including recent televisit for similar presentation.  She was started on Pepcid and Claritin  After the interventions stated above, I reevaluated the patient and found patient's tachycardia to be improved.  She has been satting between 95 and 99% on room air.  No distress.  Reviewed work-up with her and she is comfortable plan for outpatient follow-up with her primary care doctor.  Return instructions discussed   Final Clinical Impression(s) / ED Diagnoses Final diagnoses:  SOB (shortness of breath) on exertion  Nonspecific chest pain    Rx / DC Orders ED Discharge Orders     None        Terrilee Files, MD 10/07/20 [phone]

## 2020-10-06 NOTE — Discharge Instructions (Addendum)
You were seen in the emergency department for evaluation of shortness of breath and chest pain that has been going on for almost a year.  You had a chest x-ray EKG and blood work that did not show any significant abnormalities.  It will be important for you to follow-up with your primary care doctor for further evaluation of your symptoms.  We also sent off a thyroid test and this is pending at time of discharge.  You can follow-up this result in MyChart and review it with your primary care doctor.

## 2020-10-06 NOTE — ED Triage Notes (Signed)
Patient here POV from Home with SOB.  Patient states she had COVID-19 in September in which her symptoms mostly consisted of Cough & SOB. Since then the patient has been still having SOB that has been progressively worsening. When Coughing/Laughing the Patient has also had this Cramping Sensation bilaterally in her Lower Chest.  Ambulatory, GCS 15. NAD noted during Triage. No Fevers at Home.

## 2020-10-07 LAB — TSH: TSH: 6.015 u[IU]/mL — ABNORMAL HIGH (ref 0.350–4.500)
